# Patient Record
Sex: Male | Born: 1950 | Race: White | Hispanic: No | Marital: Married | Smoking: Never smoker
Health system: Southern US, Community
[De-identification: ages and names within clinical notes are randomized; demographics above are authoritative.]

## PROBLEM LIST (undated history)

## (undated) DIAGNOSIS — I10 Essential (primary) hypertension: Secondary | ICD-10-CM

---

## 2011-10-16 ENCOUNTER — Emergency Department
Admission: EM | Admit: 2011-10-16 | Discharge: 2011-10-16 | Disposition: A | Discharge status: Home / Self Care | Payer: 59 | Source: Home / Self Care | Attending: Emergency Medicine | Admitting: Emergency Medicine

## 2011-10-16 DIAGNOSIS — J069 Acute upper respiratory infection, unspecified: Secondary | ICD-10-CM

## 2011-10-16 HISTORY — DX: Essential (primary) hypertension: I10

## 2011-10-16 MED ORDER — PREDNISONE (PAK) 10 MG PO TABS: 10.0000 mg | ORAL_TABLET | Freq: Every day | ORAL | Status: AC

## 2011-10-16 MED ORDER — METHYLPREDNISOLONE SODIUM SUCC 125 MG IJ SOLR
125.0000 mg | Freq: Once | INTRAMUSCULAR | Status: AC
  Administered 2011-10-16: 125 mg via INTRAVENOUS

## 2011-10-16 NOTE — ED Notes (Signed)
Son ABT for sinus infection w/out improvement. Increase facial pain

## 2011-10-16 NOTE — ED Provider Notes (Signed)
History     CSN: [id number]  Arrival date & time 10/16/11  1644   First MD Initiated Contact with Patient 10/16/11 1655      Chief Complaint  Patient presents with  . Sinusitis    (Consider location/radiation/quality/duration/timing/severity/associated sxs/prior treatment) HPI Carlos Little is a 61 y.o. male who complains of onset of cold symptoms for 7 days.  He is already on Augmentin x4 days which isn't helping much.  He was around burning brush/trees before his symptoms started.  Also with multiple people at work who are sick and also sick people at home. No sore throat + cough No pleuritic pain No wheezing + nasal congestion + post-nasal drainage + sinus pain/pressure / teeth pain No chest congestion No itchy/red eyes + earache No hemoptysis No SOB No chills/sweats + fever No nausea No vomiting No abdominal pain No diarrhea No skin rashes No fatigue No myalgias + headache    Past Medical History  Diagnosis Date  . Hypertension     No past surgical history on file.  No family history on file.  History  Substance Use Topics  . Smoking status: Not on file  . Smokeless tobacco: Not on file  . Alcohol Use: No      Review of Systems  All other systems reviewed and are negative.    Allergies  Review of patient's allergies indicates no known allergies.  Home Medications   Current Outpatient Rx  Name Route Sig Dispense Refill  . PREDNISONE (PAK) 10 MG PO TABS Oral Take 1 tablet (10 mg total) by mouth daily. 6 day pack, use as directed, Disp 1 pack 1 tablet 0    There were no vitals taken for this visit.  Physical Exam  Nursing note and vitals reviewed. Constitutional: He is oriented to person, place, and time. He appears well-developed and well-nourished.  HENT:  Head: Normocephalic and atraumatic.  Right Ear: Tympanic membrane, external ear and ear canal normal.  Left Ear: Tympanic membrane, external ear and ear canal normal.  Nose: Mucosal  edema and rhinorrhea present.  Mouth/Throat: Posterior oropharyngeal erythema present. No oropharyngeal exudate or posterior oropharyngeal edema.  Eyes: No scleral icterus.  Neck: Neck supple.  Cardiovascular: Regular rhythm and normal heart sounds.   Pulmonary/Chest: Effort normal and breath sounds normal. No respiratory distress.  Neurological: He is alert and oriented to person, place, and time.  Skin: Skin is warm and dry.  Psychiatric: He has a normal mood and affect. His speech is normal.    ED Course  Procedures (including critical care time)  Labs Reviewed - No data to display No results found.   1. Acute upper respiratory infections of unspecified site       MDM  1)  Continue your antibiotic as instructed.  I think this is most likely viral, however I think that was treated with prednisone may help decrease some inflammation, swelling, and irritation in the sinuses. We'll also get a shot of Solu-Medrol here in clinic. If worsening, he should followup with PCP which time perhaps change antibiotic to something like Levaquin may be helpful. 2)  Use nasal saline solution (over the counter) at least 3 times a day. 3)  Use over the counter decongestants like Zyrtec-D every 12 hours as needed to help with congestion.  If you have hypertension, do not take medicines with sudafed.  4)  Can take tylenol every 6 hours or motrin every 8 hours for pain or fever. 5)  Follow up with your primary  doctor if no improvement in 5-7 days, sooner if increasing pain, fever, or new symptoms.          Marlaine Hind, MD 10/16/11 1734

## 2011-10-26 ENCOUNTER — Emergency Department
Admission: EM | Admit: 2011-10-26 | Discharge: 2011-10-26 | Disposition: A | Discharge status: Home / Self Care | Payer: 59 | Source: Home / Self Care | Attending: Emergency Medicine | Admitting: Emergency Medicine

## 2011-10-26 DIAGNOSIS — J069 Acute upper respiratory infection, unspecified: Secondary | ICD-10-CM

## 2011-10-26 DIAGNOSIS — J329 Chronic sinusitis, unspecified: Secondary | ICD-10-CM

## 2011-10-26 DIAGNOSIS — R5381 Other malaise: Secondary | ICD-10-CM

## 2011-10-26 DIAGNOSIS — R5383 Other fatigue: Secondary | ICD-10-CM

## 2011-10-26 LAB — POCT MONO SCREEN (KUC): Mono, POC: NEGATIVE

## 2011-10-26 MED ORDER — PREDNISONE 10 MG PO TABS: ORAL_TABLET | ORAL | Status: DC

## 2011-10-26 MED ORDER — LEVOFLOXACIN 500 MG PO TABS: 500.0000 mg | ORAL_TABLET | Freq: Every day | ORAL | Status: AC

## 2011-10-26 NOTE — ED Provider Notes (Signed)
History     CSN: [id number]  Arrival date & time 10/26/11  1925   First MD Initiated Contact with Patient 10/26/11 1926      Chief Complaint  Patient presents with  . Cough    (Consider location/radiation/quality/duration/timing/severity/associated sxs/prior treatment) HPI Carlos Little is a 61 y.o. male who complains of onset of cold symptoms for a few days. He was sick recently and given Augmentin and Prednisone (and IM solumedrol).  With these, he felt a lot better, but still lingering symptoms that seem to be getting worse again.  +Sick contacts.   + sore throat + cough No pleuritic pain No wheezing + nasal congestion + post-nasal drainage + sinus pain/pressure No chest congestion No itchy/red eyes No earache No hemoptysis No SOB No chills/sweats No fever No nausea No vomiting No abdominal pain No diarrhea No skin rashes + fatigue No myalgias + headache    Past Medical History  Diagnosis Date  . Hypertension     History reviewed. No pertinent past surgical history.  Family History  Problem Relation Age of Onset  . Hypertension Father     History  Substance Use Topics  . Smoking status: Never Smoker   . Smokeless tobacco: Not on file  . Alcohol Use: No      Review of Systems  All other systems reviewed and are negative.    Allergies  Review of patient's allergies indicates no known allergies.  Home Medications   Current Outpatient Rx  Name Route Sig Dispense Refill  . ASPIRIN 81 MG PO TABS Oral Take 81 mg by mouth daily.    Marland Kitchen LISINOPRIL 10 MG PO TABS Oral Take 10 mg by mouth daily.    . AMOXICILLIN-POT CLAVULANATE 875-125 MG PO TABS Oral Take 1 tablet by mouth 2 (two) times daily.    Marland Kitchen LEVOFLOXACIN 500 MG PO TABS Oral Take 1 tablet (500 mg total) by mouth daily. 7 tablet 0  . PREDNISONE 10 MG PO TABS  20mg  bid for 3 days, then 10mg  bid for 3 days, then 10mg  daily for 3 days, then stop.  Disp QS 21 tablet 0  . PREDNISONE (PAK) 10 MG PO TABS  Oral Take 1 tablet (10 mg total) by mouth daily. 6 day pack, use as directed, Disp 1 pack 1 tablet 0    BP 122/87  Pulse 101  Temp(Src) 98.9 F (37.2 C) (Oral)  Resp 16  Ht 5\' 7"  (1.702 m)  Wt 178 lb (80.74 kg)  BMI 27.88 kg/m2  SpO2 97%  Physical Exam  Nursing note and vitals reviewed. Constitutional: He is oriented to person, place, and time. He appears well-developed and well-nourished.  HENT:  Head: Normocephalic and atraumatic.  Right Ear: Tympanic membrane, external ear and ear canal normal.  Left Ear: Tympanic membrane, external ear and ear canal normal.  Nose: Mucosal edema and rhinorrhea present.  Mouth/Throat: Posterior oropharyngeal erythema present. No oropharyngeal exudate or posterior oropharyngeal edema.  Eyes: No scleral icterus.  Neck: Neck supple.  Cardiovascular: Regular rhythm and normal heart sounds.   Pulmonary/Chest: Effort normal and breath sounds normal. No respiratory distress.  Neurological: He is alert and oriented to person, place, and time.  Skin: Skin is warm and dry.  Psychiatric: He has a normal mood and affect. His speech is normal.    ED Course  Procedures (including critical care time)   Labs Reviewed  POCT MONO SCREEN [facility])   No results found.   1. Fatigue   2. Sinusitis  3. Acute upper respiratory infections of unspecified site       MDM  1)  Take the prescribed antibiotic as instructed.  Also Rx given for prednisone taper.  If not better, I discussed with patient that ENT referral may be necessary.  Mono test is negative. 2)  Use nasal saline solution (over the counter) at least 3 times a day. 3)  Use over the counter decongestants like Zyrtec-D every 12 hours as needed to help with congestion.  If you have hypertension, do not take medicines with sudafed.  4)  Can take tylenol every 6 hours or motrin every 8 hours for pain or fever. 5)  Follow up with your primary doctor if no improvement in 5-7 days, sooner if increasing  pain, fever, or new symptoms.       Marlaine Hind, MD 10/26/11 2005

## 2011-10-26 NOTE — ED Notes (Signed)
Patient complains of cough and fatigue. He states he has been sick off and on for a few weeks. He states he has slept more than usual.

## 2013-08-18 ENCOUNTER — Emergency Department (INDEPENDENT_AMBULATORY_CARE_PROVIDER_SITE_OTHER)
Admission: EM | Admit: 2013-08-18 | Discharge: 2013-08-18 | Disposition: A | Discharge status: Home / Self Care | Payer: BC Managed Care – PPO | Source: Home / Self Care | Attending: Emergency Medicine | Admitting: Emergency Medicine

## 2013-08-18 ENCOUNTER — Encounter: Payer: Self-pay | Admitting: Emergency Medicine

## 2013-08-18 DIAGNOSIS — J101 Influenza due to other identified influenza virus with other respiratory manifestations: Secondary | ICD-10-CM

## 2013-08-18 DIAGNOSIS — J111 Influenza due to unidentified influenza virus with other respiratory manifestations: Secondary | ICD-10-CM

## 2013-08-18 LAB — POCT INFLUENZA A/B
INFLUENZA A, POC: NEGATIVE
INFLUENZA B, POC: POSITIVE

## 2013-08-18 MED ORDER — OSELTAMIVIR PHOSPHATE 75 MG PO CAPS: ORAL_CAPSULE | ORAL | Status: DC

## 2013-08-18 NOTE — ED Notes (Signed)
Awoke this morning with congestion, cough, fever, body aches and nausea. No flu vaccination this season.

## 2013-08-19 NOTE — ED Provider Notes (Signed)
CSN: [id number]     Arrival date & time 08/18/13  1605 History   First MD Initiated Contact with Patient 08/18/13 1614     Chief Complaint  Patient presents with  . Fever  . Cough  . Nasal Congestion  . Nausea  . Generalized Body Aches   (Consider location/radiation/quality/duration/timing/severity/associated sxs/prior Treatment) HPI FLU  HPI : Flu symptoms for about 1 day. Fever to 102 with chills, sweats, myalgias, fatigue, headache. Symptoms are progressively worsening, despite trying OTC fever reducing medicine and rest and fluids. Has decreased appetite, but tolerating some liquids by mouth. No history of recent tick bite.  Review of Systems: Positive for fatigue, mild nasal congestion, mild sore throat, mild swollen anterior neck glands, mild cough. Negative for acute vision changes, stiff neck, focal weakness, syncope, seizures, respiratory distress, vomiting, diarrhea, GU symptoms, new Rash.  Past Medical History  Diagnosis Date  . Hypertension    History reviewed. No pertinent past surgical history. Family History  Problem Relation Age of Onset  . Hypertension Father    History  Substance Use Topics  . Smoking status: Never Smoker   . Smokeless tobacco: Not on file  . Alcohol Use: No    Review of Systems  All other systems reviewed and are negative.    Allergies  Review of patient's allergies indicates no known allergies.  Home Medications   Current Outpatient Rx  Name  Route  Sig  Dispense  Refill  . amoxicillin-clavulanate (AUGMENTIN) 875-125 MG per tablet   Oral   Take 1 tablet by mouth 2 (two) times daily.         Marland Kitchen aspirin 81 MG tablet   Oral   Take 81 mg by mouth daily.         Marland Kitchen lisinopril (PRINIVIL,ZESTRIL) 10 MG tablet   Oral   Take 10 mg by mouth daily.         Marland Kitchen oseltamivir (TAMIFLU) 75 MG capsule      Starting today, take 1 capsule by mouth twice a day for 5 days.   10 capsule   0   . predniSONE (DELTASONE) 10 MG tablet       20mg  bid for 3 days, then 10mg  bid for 3 days, then 10mg  daily for 3 days, then stop.  Disp QS   21 tablet   0    BP 113/72  Pulse 13  Temp(Src) 101.9 F (38.8 C) (Oral)  Resp 18  Ht 5\' 7"  (1.702 m)  Wt 175 lb (79.379 kg)  BMI 27.40 kg/m2  SpO2 95% Physical Exam  Nursing note and vitals reviewed. Constitutional: He appears well-developed and well-nourished.  Non-toxic appearance. He appears ill (very fatigued, but no cardiorespiratory distress). No distress.  HENT:  Head: Normocephalic and atraumatic.  Right Ear: Tympanic membrane and external ear normal.  Left Ear: Tympanic membrane and external ear normal.  Nose: Rhinorrhea present.  Mouth/Throat: Mucous membranes are normal. Posterior oropharyngeal erythema (mild redness ) present. No oropharyngeal exudate.  Eyes: Conjunctivae are normal. Right eye exhibits no discharge. Left eye exhibits no discharge. No scleral icterus.  Neck: Neck supple.  Cardiovascular: Normal rate, regular rhythm and normal heart sounds.   Pulmonary/Chest: Breath sounds normal. No stridor. No respiratory distress. He has no wheezes. He has no rales.  Abdominal: Soft. There is no tenderness.  Musculoskeletal: He exhibits no edema.  Lymphadenopathy:    He has cervical adenopathy (mild shoddy anterior cervical nodes).  Neurological: He is alert.  Skin: Skin is  warm and intact. No rash noted. He is diaphoretic.  Psychiatric: He has a normal mood and affect.    ED Course  Procedures (including critical care time) Labs Review Labs Reviewed  POCT INFLUENZA A/B   Imaging Review No results found.  EKG Interpretation    Date/Time:    Ventricular Rate:    PR Interval:    QRS Duration:   QT Interval:    QTC Calculation:   R Axis:     Text Interpretation:              MDM   1. Influenza B    Rapid flu test positive for influenza B.--Negative for influenza A. Treatment options discussed, as well as risks, benefits,  alternatives. Patient voiced understanding and agreement with the following plans: Tamiflu 75 twice a day x5 days I suggested preventive Tamiflu for family members and household 75 mg daily x10 days Other symptomatic care regarding influenza B. Contagiousness discussed. Written and verbal instructions. Precautions discussed. Red flags discussed. Questions invited and answered. Patient and wife voiced understanding and agreement.     Jacqulyn Cane, MD 08/19/13 1044

## 2013-11-19 ENCOUNTER — Telehealth: Payer: Self-pay | Admitting: Hematology & Oncology

## 2013-11-19 NOTE — Telephone Encounter (Signed)
I tried calling PCP office not able to reach anyone, I called and they put me in voice mail, when I called back they had forward their phones. I received a fax but I'm not sure it's a referral. I faxed chart and cover sheet to PCP and asked them to call me and if this is a referral.

## 2013-11-19 NOTE — Telephone Encounter (Signed)
Pt called wants to make appointment. He is aware his PCP needs to fax [place] referral.

## 2014-01-11 ENCOUNTER — Telehealth: Payer: Self-pay | Admitting: Hematology & Oncology

## 2014-01-11 NOTE — Telephone Encounter (Signed)
Left vm w NEW PATIENT today to remind them of their appointment with Dr. Ennever. Also, advised them to bring all medication bottles and insurance card information. ° °

## 2014-01-14 ENCOUNTER — Encounter: Payer: Self-pay | Admitting: Lab

## 2014-01-14 ENCOUNTER — Ambulatory Visit (HOSPITAL_BASED_OUTPATIENT_CLINIC_OR_DEPARTMENT_OTHER): Payer: BC Managed Care – PPO | Admitting: Lab

## 2014-01-14 ENCOUNTER — Ambulatory Visit: Payer: BC Managed Care – PPO

## 2014-01-14 ENCOUNTER — Ambulatory Visit (HOSPITAL_BASED_OUTPATIENT_CLINIC_OR_DEPARTMENT_OTHER): Payer: BC Managed Care – PPO | Admitting: Hematology & Oncology

## 2014-01-14 DIAGNOSIS — R760 Raised antibody titer: Secondary | ICD-10-CM

## 2014-01-14 DIAGNOSIS — R894 Abnormal immunological findings in specimens from other organs, systems and tissues: Secondary | ICD-10-CM

## 2014-01-14 LAB — CBC WITH DIFFERENTIAL (CANCER CENTER ONLY)
BASO#: 0 10*3/uL (ref 0.0–0.2)
BASO%: 0.4 % (ref 0.0–2.0)
EOS ABS: 0.2 10*3/uL (ref 0.0–0.5)
EOS%: 2.6 % (ref 0.0–7.0)
HCT: 44.1 % (ref 38.7–49.9)
HGB: 15.4 g/dL (ref 13.0–17.1)
LYMPH#: 2.3 10*3/uL (ref 0.9–3.3)
LYMPH%: 27.9 % (ref 14.0–48.0)
MCH: 32.2 pg (ref 28.0–33.4)
MCHC: 34.9 g/dL (ref 32.0–35.9)
MCV: 92 fL (ref 82–98)
MONO#: 0.7 10*3/uL (ref 0.1–0.9)
MONO%: 8.7 % (ref 0.0–13.0)
NEUT%: 60.4 % (ref 40.0–80.0)
NEUTROS ABS: 5.1 10*3/uL (ref 1.5–6.5)
PLATELETS: 299 10*3/uL (ref 145–400)
RBC: 4.79 10*6/uL (ref 4.20–5.70)
RDW: 11.7 % (ref 11.1–15.7)
WBC: 8.4 10*3/uL (ref 4.0–10.0)

## 2014-01-14 MED ORDER — FOLIC ACID 1 MG PO TABS: 1.0000 mg | ORAL_TABLET | Freq: Every day | ORAL | Status: DC

## 2014-01-14 MED ORDER — ASPIRIN 325 MG PO TABS: 325.0000 mg | ORAL_TABLET | Freq: Every day | ORAL | Status: DC

## 2014-01-14 MED ORDER — RIVAROXABAN 10 MG PO TABS: ORAL_TABLET | ORAL | Status: DC

## 2014-01-15 NOTE — Progress Notes (Signed)
Referral MD  Reason for Referral: IgM anti-Cardiolipin antibody (+)                                   (+) Lupus Anticoagulant  No chief complaint on file. : " Is there any blood clotting problems?"  HPI: [name] is a very nice 63 year old gentleman. He has been pretty healthy. He has had some vague neurological issues. He has had a studies for this. He's had scans, Dopplers, x-rays. Everything has come back unremarkable. His doctor decided to do a hypercoagulable panel on him. This showed that he had a elevated IgM anticardiolipin antibody titer of 64. He also was found to have a positive lupus anticoagulant. No other studies were performed.  He has been on aspirin. At that he takes 2 baby aspirin a day.  He is worried that he has upcoming right shoulder surgery. This will be an arthroscopic. He needs to be off aspirin. He's worried about this.  His family Dr. Mitchell Heir refered him to [place] to see if we cannot help him out.  [name] has had no obvious stroke or TIA. He's had no obvious thromboembolic disease.  I think there is a history of stroke in the family.  He's had no rashes. He's had no cough. No chest pain. He said the shortness of breath. He's had no nausea vomiting. There's been no change in bowel or bladder habits. He is on testosterone shots. He gets 2 injections a week.  He has had a good appetite. He has tried to exercise. He has had no type foreign travel or lengthy travel.     Past Medical History  Diagnosis Date  . Hypertension   :  No past surgical history on file.:  Current outpatient prescriptions:amoxicillin-clavulanate (AUGMENTIN) 875-125 MG per tablet, Take 1 tablet by mouth 2 (two) times daily., Disp: , Rfl: ;  aspirin (BAYER ASPIRIN) 325 MG tablet, Take 1 tablet (325 mg total) by mouth daily., Disp: , Rfl: ;  folic acid (FOLVITE) 1 MG tablet, Take 1 tablet (1 mg total) by mouth daily., Disp: 30 tablet, Rfl: 12;  lisinopril (PRINIVIL,ZESTRIL) 10 MG tablet,  Take 10 mg by mouth daily., Disp: , Rfl:  oseltamivir (TAMIFLU) 75 MG capsule, Starting today, take 1 capsule by mouth twice a day for 5 days., Disp: 10 capsule, Rfl: 0;  predniSONE (DELTASONE) 10 MG tablet, 20mg  bid for 3 days, then 10mg  bid for 3 days, then 10mg  daily for 3 days, then stop.  Disp QS, Disp: 21 tablet, Rfl: 0;  rivaroxaban (XARELTO) 10 MG TABS tablet, Take 1 a pill a day for 5 days then stop 2 days before surgery, Disp: 5 tablet, Rfl: 0:  :  No Known Allergies:  Family History  Problem Relation Age of Onset  . Hypertension Father   :  History   Social History  . Marital Status: Married    Spouse Name: N/A    Number of Children: N/A  . Years of Education: N/A   Occupational History  . Not on file.   Social History Main Topics  . Smoking status: Never Smoker   . Smokeless tobacco: Not on file  . Alcohol Use: No  . Drug Use: No  . Sexual Activity:    Other Topics Concern  . Not on file   Social History Narrative  . No narrative on file  :  Pertinent items are noted in  HPI.  Exam: @IPVITALS @ well-developed and well-nourished white gentleman in no obvious distress. Vital signs show a temperature of 98. Pulse 82. Blood pressure 138/81. Weight is 176 pounds. Head and neck exam shows no ocular or oral lesions. There are no palpable cervical or supraclavicular lymph nodes. Lungs are clear. Cardiac exam irregular rate and rhythm with no murmurs rubs or bruits. Abdomen is soft. Has good bowel sounds. There is no fluid wave. There is no palpable liver or spleen tip. Back exam no tenderness over the spine ribs or hips. Extremities shows no clubbing cyanosis or edema. Has good range of motion of his joints. Skin exam shows no rashes. Neurological exam shows no focal neurological deficits.    Recent Labs  01/14/14 1324  WBC 8.4  HGB 15.4  HCT 44.1  PLT 299   No results found for this basename: NA, K, CL, CO2, GLUCOSE, BUN, CREATININE, CALCIUM,  in the last 72  hours  Blood smear review:Not relevant  Pathology:No data     Assessment and Plan: Carlos Little is a 63 year old gentleman. He has not had a documented thromboembolic event. This includes both arterial or venous events. 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 He also has a positive lupus anticoagulant. I am repeating his studies. I think these were done back in January. We will see what they look like now.  I would think that he probably needs to be on a full dose aspirin. If he has a cerebrovascular event or cardiovascular event on a full dose aspirin, then Plavix probably will be needed.  He also has a mutated MTHFR chain. I am really not sure what this really means but I told him to take 1 mg of folic acid a day.  Been on testosterone replacement therapy, in my mind, does increase the risk of thrombotic events. However, this is something that makes him feel better and more productive. It would be hard to stop this as he would likely have negative effects from this.  Am not sure he really needs to have it 2 injections a week. I think that he does stay on testosterone supplementation, I think that we need to  keep his testosterone level just above the normal range of therapeutic.  We will see what his hypercoagulable panel shows.  His wife is a Marine scientist. She currently has a good understanding of the issues. He also has a very good understanding of what is going on.  I told them that our goal was to minimize his risk of actual thromboembolic events.  As far as his shoulder surgery goes, I think that getting him transitioned over to some of Xarelto before surgery would make sense. I think this would be reasonable. It would give him a piece of mind that he is on some blood thinner so that he would not have a stroke.  I think Xarelto at 10 mg a day for 5 days and then stop for 2 days prior to surgery would be reasonable. After surgery, are just plan to get him back on  aspirin.  I had a long talk with Mr. Minney and his wife. I spent over an hour with him. I went over the lab work that had been done before.  Hopefully, I don't think that we have to see him back in the clinic. I not sure, if we really be helping. If she does have a thromboembolic event, he will definitely need to be seen.

## 2014-01-17 LAB — HYPERCOAGULABLE PANEL, COMPREHENSIVE
ANTICARDIOLIPIN IGG: 11 GPL U/mL (ref <23)
ANTICARDIOLIPIN IGM: 29 [MPL'U]/mL — AB (ref <11)
AntiThromb III Func: 105 % (ref 76–126)
Anticardiolipin IgA: 4 APL U/mL (ref <22)
BETA 2 GLYCO I IGG: 13 G Units (ref <20)
Beta-2-Glycoprotein I IgA: 26 A Units — ABNORMAL HIGH (ref <20)
Beta-2-Glycoprotein I IgM: 60 M Units — ABNORMAL HIGH (ref <20)
DRVVT 1 1 MIX: 44.3 s — AB (ref <42.9)
DRVVT: 91.5 s — AB (ref <42.9)
Drvvt confirmation: 2.29 Ratio — ABNORMAL HIGH (ref <1.11)
LUPUS ANTICOAGULANT: DETECTED — AB
PROTEIN C ACTIVITY: 161 % — AB (ref 75–133)
PROTEIN S TOTAL: 87 % (ref 60–150)
PTT Lupus Anticoagulant: 47.2 secs — ABNORMAL HIGH (ref 28.0–43.0)
PTTLA 4:1 Mix: 46.3 secs — ABNORMAL HIGH (ref 28.0–43.0)
PTTLA CONFIRMATION: 17.6 s — AB (ref <8.0)
Protein C, Total: 90 % (ref 72–160)
Protein S Activity: 103 % (ref 69–129)

## 2014-01-17 LAB — APTT: aPTT: 40 seconds — ABNORMAL HIGH (ref 24–37)

## 2014-01-17 LAB — PROTHROMBIN TIME
INR: 0.98 (ref <1.50)
PROTHROMBIN TIME: 12.9 s (ref 11.6–15.2)

## 2014-01-23 LAB — HEXAGONAL PHOSPHOLIPID NEUTRALIZATION: Hex Phosph Neut Test: POSITIVE — AB

## 2015-01-22 ENCOUNTER — Other Ambulatory Visit: Payer: Self-pay | Admitting: *Deleted

## 2015-01-23 ENCOUNTER — Other Ambulatory Visit: Payer: Self-pay

## 2015-01-23 ENCOUNTER — Ambulatory Visit (HOSPITAL_BASED_OUTPATIENT_CLINIC_OR_DEPARTMENT_OTHER): Payer: BLUE CROSS/BLUE SHIELD | Admitting: Hematology & Oncology

## 2015-01-23 ENCOUNTER — Encounter: Payer: Self-pay | Admitting: Hematology & Oncology

## 2015-01-23 DIAGNOSIS — R768 Other specified abnormal immunological findings in serum: Secondary | ICD-10-CM | POA: Diagnosis not present

## 2015-01-23 DIAGNOSIS — Z7901 Long term (current) use of anticoagulants: Secondary | ICD-10-CM

## 2015-01-23 DIAGNOSIS — D6862 Lupus anticoagulant syndrome: Secondary | ICD-10-CM

## 2015-01-23 DIAGNOSIS — R760 Raised antibody titer: Secondary | ICD-10-CM

## 2015-01-23 MED ORDER — FOLIC ACID 1 MG PO TABS: 2.0000 mg | ORAL_TABLET | Freq: Every day | ORAL | Status: DC

## 2015-01-23 MED ORDER — RIVAROXABAN 10 MG PO TABS: ORAL_TABLET | ORAL | Status: DC

## 2015-01-23 NOTE — Progress Notes (Signed)
Hematology and Oncology Follow Up Visit  Carlos Little [id number] [birth year] 64 y.o. 01/23/2015   Principle Diagnosis:    (+)Lupus anticoagulant and IgM anti-cardiolipin antibody    no history of thromboembolic disease  Current Therapy:     Xarelto 5 mg by mouth daily    folic acid 2 mg by mouth daily     Interim History:  Carlos Little is back to see [place] for a visit. We first saw him back in June 2015. At that point time, his  Hypercoagulable studies showed a positive lupus anticoagulant that was confirmed positive via the Hexose phosphate neutralization test. He had a positive IgM anticardiolipin antibody at a titer of 29. His IgM beta-2 glycoprotein level was high at 60.   At that point time, I just put him on full dose aspirin. He had never had a thromboembolic event.  The problem that he has now is at he has rotator cuff issues with his  Right shoulder. He has seen a specialist who wants to do plasma injections and not surgery. To do this, he needs to be off aspirin for 4 months. Afterwards, his right shoulder needs to be immobilized for a month and he still cannot be on any aspirin for 2 months.   This is incredibly concerning for Carlos Little with respect to having blood clots. Again, he is never had a blood clot despite the positive blood tests.   I worry that with the immobilization that will happen with his right arm, that he will be at significant risk for thromboembolic disease.   he is feeling well otherwise. He really cannot work out the way he would like because of the pain in the right shoulder secondary to the rotator cuff tear.    his appetite is good. He's had no problems with nausea or vomiting. Medications:  Current outpatient prescriptions:  .  aspirin (BAYER ASPIRIN) 325 MG tablet, Take 1 tablet (325 mg total) by mouth daily., Disp: , Rfl:  .  folic acid (FOLVITE) 1 MG tablet, Take 1 tablet (1 mg total) by mouth daily., Disp: 30 tablet, Rfl: 12 .  lisinopril  (PRINIVIL,ZESTRIL) 10 MG tablet, Take 10 mg by mouth daily., Disp: , Rfl:  .  rivaroxaban (XARELTO) 10 MG TABS tablet, Please take one half tablet a day. Stop 2 days before any invasive procedure., Disp: 30 tablet, Rfl: 6 .  folic acid (FOLVITE) 1 MG tablet, Take 2 tablets (2 mg total) by mouth daily., Disp: 60 tablet, Rfl: 8 .  testosterone cypionate (DEPOTESTOSTERONE CYPIONATE) 200 MG/ML injection, , Disp: , Rfl: 0  Allergies: No Known Allergies  Past Medical History, Surgical history, Social history, and Family History were reviewed and updated.  Review of Systems:  as above  Physical Exam:  vitals were not taken for this visit.  Wt Readings from Last 3 Encounters:  01/14/14 176 lb (79.833 kg)  08/18/13 175 lb (79.379 kg)  10/26/11 178 lb (80.74 kg)      well-developed and well-nourished white gentleman. Head and neck exam shows no ocular or oral lesions. He has no palpable cervical or supraclavicular lymph nodes. Lungs are clear. Cardiac exam regular rate and rhythm with no murmurs, rubs or bruits. Abdomen is soft. He has good bowel sounds. There is no fluid wave. There is no palpable liver or spleen tip. Back exam shows no tenderness over the spine, ribs or hips. Extremities show decreased range of motion of the right arm. Left arm is unremarkable. 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 He has good range of motion of his legs. Neurological exam is nonfocal.  Lab Results  Component Value Date   WBC 8.4 01/14/2014   HGB 15.4 01/14/2014   HCT 44.1 01/14/2014   MCV 92 01/14/2014   PLT 299 01/14/2014     Chemistry   No results found for: NA, K, CL, CO2, BUN, CREATININE, GLU No results found for: CALCIUM, ALKPHOS, AST, ALT, BILITOT       Impression and Plan: Carlos Little is  64 year old gentleman with a positive lupus anticoagulant and a elevated IgM anti-cardiolipin antibody. He has a rotator cuff issues.    I told him that I saw no problems with him having this plasma  injection. I told him that we can certainly adjust his "anticoagulant " therapy.  I think what would be reasonable would be to have  hm on 5 mg of Xarelto.  I also would put him on folic acid at 2 mg a day. I think this should be very reasonable for anticoagulant protection.   I told him that I would stop the Xarelto 2 days before any procedure that is done for his right shoulder. Afterwards, since his right arm will be immobilized for a month, I would then put him on 10 mg a day of Xarelto.   He is not sure when all this will be done. He will speak to the specialist about all of this. I told him to have the specialist call me if there are any issues.   I told Carlos Little to call me to let me know when the procedure will be done so that we can see him back and make sure that he is on the right therapy after the procedure.   I spent about 35 minutes with he and his wife today.   Volanda Napoleon, MD 6/16/20162:09 PM

## 2015-01-28 ENCOUNTER — Ambulatory Visit: Payer: Self-pay | Admitting: Hematology & Oncology

## 2015-01-28 ENCOUNTER — Other Ambulatory Visit: Payer: Self-pay

## 2015-06-20 ENCOUNTER — Other Ambulatory Visit: Payer: Self-pay | Admitting: *Deleted

## 2015-06-20 DIAGNOSIS — D472 Monoclonal gammopathy: Secondary | ICD-10-CM

## 2015-06-23 ENCOUNTER — Encounter: Payer: Self-pay | Admitting: Hematology & Oncology

## 2015-06-23 ENCOUNTER — Other Ambulatory Visit (HOSPITAL_BASED_OUTPATIENT_CLINIC_OR_DEPARTMENT_OTHER): Payer: BLUE CROSS/BLUE SHIELD

## 2015-06-23 ENCOUNTER — Ambulatory Visit (HOSPITAL_BASED_OUTPATIENT_CLINIC_OR_DEPARTMENT_OTHER): Payer: BLUE CROSS/BLUE SHIELD | Admitting: Hematology & Oncology

## 2015-06-23 VITALS — BP 139/79 | HR 88 | Temp 98.0°F | Resp 18 | Ht 66.0 in | Wt 173.0 lb

## 2015-06-23 DIAGNOSIS — R760 Raised antibody titer: Secondary | ICD-10-CM

## 2015-06-23 DIAGNOSIS — D472 Monoclonal gammopathy: Secondary | ICD-10-CM

## 2015-06-23 DIAGNOSIS — D6862 Lupus anticoagulant syndrome: Secondary | ICD-10-CM | POA: Diagnosis not present

## 2015-06-23 LAB — PROTIME-INR (CHCC SATELLITE)
INR: 1 — AB (ref 2.0–3.5)
PROTIME: 12 s (ref 10.6–13.4)

## 2015-06-23 NOTE — Progress Notes (Signed)
Hematology and Oncology Follow Up Visit  Carlos Little [id number] [birth year] 64 y.o. 06/23/2015   Principle Diagnosis:    (+)Lupus anticoagulant and IgM anti-cardiolipin antibody    No history of thromboembolic disease  Current Therapy:     Xarelto 5 mg by mouth daily    folic acid 2 mg by mouth daily     Interim History:  Carlos Little is back to see [place] for a visit. We first saw him back in June 2015. At that point time, his  Hypercoagulable studies showed a positive lupus anticoagulant that was confirmed positive via the Hexose phosphate neutralization test. He had a positive IgM anticardiolipin antibody at a titer of 29. His IgM beta-2 glycoprotein level was high at 60.   We saw him back in June of this year, he still was positive for the lupus anticoagulant his IgM anticardiolipin antibody was 29. He had an elevated beta-2 glycoprotein of 60.  He probably will be having the plasma injection into his right shoulder in December. I think he will be on December 12.  He says that with the Xarelto, he has occasional dizziness. He had one episode of confusion. I told him I just do not think that this was from the [place].  He's had no obvious bleeding.  He's not been able to work out that much because of the shoulder.  .Medications:  Current outpatient prescriptions:  .  anastrozole (ARIMIDEX) 1 MG tablet, TAKE 1/4 TABLET TWICE WEEKLY, Disp: , Rfl: 5 .  ARMOUR THYROID 90 MG tablet, Take 90 mg by mouth daily., Disp: , Rfl: 2 .  folic acid (FOLVITE) 1 MG tablet, Take 1 tablet (1 mg total) by mouth daily., Disp: 30 tablet, Rfl: 12 .  folic acid (FOLVITE) 1 MG tablet, Take 2 tablets (2 mg total) by mouth daily., Disp: 60 tablet, Rfl: 8 .  lisinopril (PRINIVIL,ZESTRIL) 10 MG tablet, Take 5 mg by mouth daily. , Disp: , Rfl:  .  rivaroxaban (XARELTO) 10 MG TABS tablet, Please take one half tablet a day. Stop 2 days before any invasive procedure., Disp: 30 tablet, Rfl: 6 .  testosterone  cypionate (DEPOTESTOSTERONE CYPIONATE) 200 MG/ML injection, , Disp: , Rfl: 0 .  VIAGRA 100 MG tablet, TAKE 1 TABLET BY MOUTH 1 HOUR PRIOR TO INTERCOURSE, Disp: , Rfl: 5  Allergies: No Known Allergies  Past Medical History, Surgical history, Social history, and Family History were reviewed and updated.  Review of Systems:  as above  Physical Exam:  height is 5\' 6"  (1.676 m) and weight is 173 lb (78.472 kg). His oral temperature is 98 F (36.7 C). His blood pressure is 139/79 and his pulse is 88. His respiration is 18.   Wt Readings from Last 3 Encounters:  06/23/15 173 lb (78.472 kg)  01/14/14 176 lb (79.833 kg)  08/18/13 175 lb (79.379 kg)      well-developed and well-nourished white gentleman. Head and neck exam shows no ocular or oral lesions. He has no palpable cervical or supraclavicular lymph nodes. Lungs are clear. Cardiac exam regular rate and rhythm with no murmurs, rubs or bruits. Abdomen is soft. He has good bowel sounds. There is no fluid wave. There is no palpable liver or spleen tip. Back exam shows no tenderness over the spine, ribs or hips. Extremities show decreased range of motion of the right arm. Left arm is unremarkable. 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 He has good range of motion of his legs. Neurological exam is nonfocal.  Lab Results  Component Value Date   WBC 8.4 01/14/2014   HGB 15.4 01/14/2014   HCT 44.1 01/14/2014   MCV 92 01/14/2014   PLT 299 01/14/2014     Chemistry   No results found for: NA, K, CL, CO2, BUN, CREATININE, GLU No results found for: CALCIUM, ALKPHOS, AST, ALT, BILITOT       Impression and Plan: Carlos Little is  64 year old gentleman with a positive lupus anticoagulant and a elevated IgM anti-cardiolipin antibody. He has a rotator cuff issues.    I told him that I saw no problems with him having this plasma injection. I told him that we can certainly adjust his "anticoagulant " therapy.  I think what would be reasonable would  be to have  hm on 5 mg of Xarelto.  I also would put him on folic acid at 2 mg a day. I think this should be very reasonable for anticoagulant protection.   I told him that I would stop the Xarelto 2 days before any procedure that is done for his right shoulder. Afterwards, I would restart his Xarelto at 5 mg a day. I'll start this the day after his procedure. Apparently, his shoulder will be immobilized for a month.  I also told him that it is important that he take the folic acid daily.  I will like to see him back probably 3 weeks or so after the procedure.  Again, since he does not have any issue or any past history of thromboembolic disease, we probably can get him off the Xarelto after his shoulder attain some mobility.  I spent about 35 minutes with he and his wife. They're always fun to talk to.   Volanda Napoleon, MD 11/14/20165:36 PM

## 2015-06-24 LAB — APTT: aPTT: 49 seconds — ABNORMAL HIGH (ref 24–37)

## 2015-08-18 ENCOUNTER — Other Ambulatory Visit (HOSPITAL_BASED_OUTPATIENT_CLINIC_OR_DEPARTMENT_OTHER): Payer: BLUE CROSS/BLUE SHIELD

## 2015-08-18 ENCOUNTER — Encounter: Payer: Self-pay | Admitting: Hematology & Oncology

## 2015-08-18 ENCOUNTER — Ambulatory Visit (HOSPITAL_BASED_OUTPATIENT_CLINIC_OR_DEPARTMENT_OTHER): Payer: BLUE CROSS/BLUE SHIELD | Admitting: Hematology & Oncology

## 2015-08-18 VITALS — BP 121/81 | HR 86 | Temp 98.6°F | Resp 16 | Ht 66.0 in | Wt 173.0 lb

## 2015-08-18 DIAGNOSIS — R790 Abnormal level of blood mineral: Secondary | ICD-10-CM | POA: Diagnosis not present

## 2015-08-18 DIAGNOSIS — R760 Raised antibody titer: Secondary | ICD-10-CM

## 2015-08-18 DIAGNOSIS — R894 Abnormal immunological findings in specimens from other organs, systems and tissues: Secondary | ICD-10-CM | POA: Diagnosis not present

## 2015-08-18 LAB — CBC WITH DIFFERENTIAL (CANCER CENTER ONLY)
BASO#: 0 10*3/uL (ref 0.0–0.2)
BASO%: 0.7 % (ref 0.0–2.0)
EOS ABS: 0.1 10*3/uL (ref 0.0–0.5)
EOS%: 1.8 % (ref 0.0–7.0)
HCT: 44.8 % (ref 38.7–49.9)
HGB: 15.4 g/dL (ref 13.0–17.1)
LYMPH#: 1.9 10*3/uL (ref 0.9–3.3)
LYMPH%: 34.5 % (ref 14.0–48.0)
MCH: 30.7 pg (ref 28.0–33.4)
MCHC: 34.4 g/dL (ref 32.0–35.9)
MCV: 89 fL (ref 82–98)
MONO#: 0.8 10*3/uL (ref 0.1–0.9)
MONO%: 14.1 % — AB (ref 0.0–13.0)
NEUT#: 2.8 10*3/uL (ref 1.5–6.5)
NEUT%: 48.9 % (ref 40.0–80.0)
PLATELETS: 289 10*3/uL (ref 145–400)
RBC: 5.01 10*6/uL (ref 4.20–5.70)
RDW: 11.7 % (ref 11.1–15.7)
WBC: 5.6 10*3/uL (ref 4.0–10.0)

## 2015-08-18 LAB — COMPREHENSIVE METABOLIC PANEL
ALT: 30 U/L (ref 0–55)
AST: 22 U/L (ref 5–34)
Albumin: 3.8 g/dL (ref 3.5–5.0)
Alkaline Phosphatase: 67 U/L (ref 40–150)
Anion Gap: 7 mEq/L (ref 3–11)
BUN: 12.7 mg/dL (ref 7.0–26.0)
CO2: 25 meq/L (ref 22–29)
Calcium: 9 mg/dL (ref 8.4–10.4)
Chloride: 105 mEq/L (ref 98–109)
Creatinine: 0.9 mg/dL (ref 0.7–1.3)
GLUCOSE: 94 mg/dL (ref 70–140)
POTASSIUM: 4.4 meq/L (ref 3.5–5.1)
SODIUM: 137 meq/L (ref 136–145)
Total Bilirubin: 0.78 mg/dL (ref 0.20–1.20)
Total Protein: 6.8 g/dL (ref 6.4–8.3)

## 2015-08-18 NOTE — Progress Notes (Signed)
Hematology and Oncology Follow Up Visit  Carlos Little [id number] 04/25/1951 65 y.o. 08/18/2015   Principle Diagnosis:    (+)Lupus anticoagulant and IgM anti-cardiolipin antibody    No history of thromboembolic disease  Current Therapy:     Xarelto 5 mg by mouth daily - to stop this and go onto aspirin when cleared by his rotator cuff specialist   folic acid 2 mg by mouth daily     Interim History:  Carlos Little is back to see [place] for a visit. He had the platelet injections into his shoulder back in December. He goes to see his doctor tomorrow. He is still not doing any physical therapy. Hopefully, this will start soon.  He has done well with the Xarelto. He is on 5 mg. He's had no issues with swelling or blood clots.  He's not been able to work out that much because of the shoulder.  It sounds like he may had to have actual rotator cuff surgery. When he goes back to see the specialist, a decision will be made.  Is not able to take any kind of aspirin or nonsteroidal anti-inflammatory drug right now. Hopefully, he will be able to get back on aspirin. If so, then he can stop the Xarelto. I talked him about this.  He's had no issues with bleeding. He's had no change in bowel or bladder habits. He's had no leg swelling or leg pain.  Overall, his performance status is ECOG 1.  .Medications:  Current outpatient prescriptions:  .  anastrozole (ARIMIDEX) 1 MG tablet, TAKE 1/4 TABLET TWICE WEEKLY, Disp: , Rfl: 5 .  ARMOUR THYROID 90 MG tablet, Take 90 mg by mouth daily., Disp: , Rfl: 2 .  folic acid (FOLVITE) 1 MG tablet, Take 1 tablet (1 mg total) by mouth daily., Disp: 30 tablet, Rfl: 12 .  folic acid (FOLVITE) 1 MG tablet, Take 2 tablets (2 mg total) by mouth daily., Disp: 60 tablet, Rfl: 8 .  lisinopril (PRINIVIL,ZESTRIL) 10 MG tablet, Take 5 mg by mouth daily. , Disp: , Rfl:  .  rivaroxaban (XARELTO) 10 MG TABS tablet, Please take one half tablet a day. Stop 2 days before any  invasive procedure., Disp: 30 tablet, Rfl: 6 .  testosterone cypionate (DEPOTESTOSTERONE CYPIONATE) 200 MG/ML injection, , Disp: , Rfl: 0 .  VIAGRA 100 MG tablet, TAKE 1 TABLET BY MOUTH 1 HOUR PRIOR TO INTERCOURSE, Disp: , Rfl: 5  Allergies: No Known Allergies  Past Medical History, Surgical history, Social history, and Family History were reviewed and updated.  Review of Systems:  as above  Physical Exam:  height is 5\' 6"  (1.676 m) and weight is 173 lb (78.472 kg). His oral temperature is 98.6 F (37 C). His blood pressure is 121/81 and his pulse is 86. His respiration is 16.   Wt Readings from Last 3 Encounters:  08/18/15 173 lb (78.472 kg)  06/23/15 173 lb (78.472 kg)  01/14/14 176 lb (79.833 kg)      well-developed and well-nourished white gentleman. Head and neck exam shows no ocular or oral lesions. He has no palpable cervical or supraclavicular lymph nodes. Lungs are clear. Cardiac exam regular rate and rhythm with no murmurs, rubs or bruits. Abdomen is soft. He has good bowel sounds. There is no fluid wave. There is no palpable liver or spleen tip. Back exam shows no tenderness over the spine, ribs or hips. Extremities show decreased range of motion of the right arm. Left arm is unremarkable. 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 He has good range of motion of his legs. Neurological exam is nonfocal.  Lab Results  Component Value Date   WBC 5.6 08/18/2015   HGB 15.4 08/18/2015   HCT 44.8 08/18/2015   MCV 89 08/18/2015   PLT 289 08/18/2015     Chemistry      Component Value Date/Time   NA 137 08/18/2015 1107   K 4.4 08/18/2015 1107   CO2 25 08/18/2015 1107   BUN 12.7 08/18/2015 1107   CREATININE 0.9 08/18/2015 1107      Component Value Date/Time   CALCIUM 9.0 08/18/2015 1107   ALKPHOS 67 08/18/2015 1107   AST 22 08/18/2015 1107   ALT 30 08/18/2015 1107   BILITOT 0.78 08/18/2015 1107         Impression and Plan: Carlos Little is  65 year old gentleman with a  positive lupus anticoagulant and an elevated IgM anti-cardiolipin antibody.  minutes with he and his wife. They're always fun to talk to.   For now, he will stay on the Xarelto. He is just on low-dose Xarelto. He's also on folic acid.  Whenever his rotator cuff specialist says he go on to aspirin, I told him that would be fine and that he get off the Xarelto. He needs to stay on the folic acid.  If, for some reason, he will need actual rotator cuff surgery, I told him to let us know so we can monitor his anticoagulation in the perioperative period.  I don't think we have to see him back unless he is going to have surgery in the future.   Volanda Napoleon, MD 1/9/20174:31 PM

## 2015-08-20 LAB — CARDIOLIPIN ANTIBODIES, IGG, IGM, IGA
Anticardiolipin Ab,IgA,Qn: <9 APL U/mL (ref 0–11)
Anticardiolipin Ab,IgM,Qn: 74 MPL U/mL — ABNORMAL HIGH (ref 0–12)

## 2015-08-21 ENCOUNTER — Telehealth: Payer: Self-pay | Admitting: *Deleted

## 2015-08-21 NOTE — Telephone Encounter (Signed)
-----   Message from Volanda Napoleon, MD sent at 08/21/2015  7:09 AM EST ----- Please fax this results to the MD that is helping his right shoulder!!!  Send a copy of this to the patient!!  Thanks!! pete

## 2015-08-22 LAB — LUPUS ANTICOAGULANT PANEL
DRVVT CONFIRM: 2.7 ratio — AB (ref 0.8–1.2)
DRVVT MIX: 62.2 s — AB (ref 0.0–44.0)
HEXAGONAL PHASE PHOSPHOLIPID: 50 s — AB (ref 0–11)
PTT-LA Mix: 60.1 s — ABNORMAL HIGH (ref 0.0–40.6)
PTT-LA: 64.3 s — ABNORMAL HIGH (ref 0.0–40.6)
dRVVT: 125.6 s — ABNORMAL HIGH (ref 0.0–44.0)

## 2015-09-03 ENCOUNTER — Encounter: Payer: Self-pay | Admitting: *Deleted

## 2015-09-03 ENCOUNTER — Emergency Department
Admission: EM | Admit: 2015-09-03 | Discharge: 2015-09-03 | Disposition: A | Discharge status: Home / Self Care | Payer: BLUE CROSS/BLUE SHIELD | Source: Home / Self Care | Attending: Family Medicine | Admitting: Family Medicine

## 2015-09-03 ENCOUNTER — Emergency Department (INDEPENDENT_AMBULATORY_CARE_PROVIDER_SITE_OTHER): Payer: BLUE CROSS/BLUE SHIELD

## 2015-09-03 DIAGNOSIS — R079 Chest pain, unspecified: Secondary | ICD-10-CM | POA: Diagnosis not present

## 2015-09-03 DIAGNOSIS — R350 Frequency of micturition: Secondary | ICD-10-CM

## 2015-09-03 DIAGNOSIS — R0789 Other chest pain: Secondary | ICD-10-CM

## 2015-09-03 LAB — POCT CBC W AUTO DIFF (K'VILLE URGENT CARE)

## 2015-09-03 LAB — POCT URINALYSIS DIP (MANUAL ENTRY)
BILIRUBIN UA: NEGATIVE
Bilirubin, UA: NEGATIVE
Glucose, UA: NEGATIVE
Nitrite, UA: NEGATIVE
PROTEIN UA: NEGATIVE
RBC UA: NEGATIVE
SPEC GRAV UA: 1.015 (ref 1.005–1.03)
UROBILINOGEN UA: 0.2 (ref 0–1)
pH, UA: 5.5 (ref 5–8)

## 2015-09-03 MED ORDER — SULFAMETHOXAZOLE-TRIMETHOPRIM 800-160 MG PO TABS: 1.0000 | ORAL_TABLET | Freq: Two times a day (BID) | ORAL | Status: DC

## 2015-09-03 NOTE — Discharge Instructions (Signed)
Increase fluid intake. °If symptoms become significantly worse during the night or over the weekend, proceed to the local emergency room.  °

## 2015-09-03 NOTE — ED Provider Notes (Signed)
CSN: [id number]     Arrival date & time 09/03/15  1312 History   First MD Initiated Contact with Patient 09/03/15 1327     Chief Complaint  Patient presents with  . Otalgia  . Dysuria  . Chest Pain      HPI Comments: Patient notes that he had a cold-like illness about a month ago which resolved.  During the past week he has had a vague intermittent mild pain under his left breast.  The pain does not occur with physical activity.  He visited his cardiologis yesterday where an EKG showed no changes.  Echocardiogram was negative, and troponin was negative.  During the past 6 days he has felt "loopey," without feeling dizzy.   About one week ago after intercourse he developed mild dysuria/frequency that has persisted.  He has had chills for the past 3 to 4 days, and mild lower back ache.  He also has had mild sore throat and sinus congestion for 3 to 4 days.  Last night he swallowed some lettuce followed by a coughing episode, and today his left chest discomfort is worse with increased cough.  He is concerned that he may have aspirated.  The history is provided by the patient and the spouse.    Past Medical History  Diagnosis Date  . Hypertension    History reviewed. No pertinent past surgical history. Family History  Problem Relation Age of Onset  . Hypertension Father    Social History  Substance Use Topics  . Smoking status: Never Smoker   . Smokeless tobacco: Never Used     Comment: NEVER USED TOBACCO  . Alcohol Use: No    Review of Systems + sore throat + cough ? pleuritic pain No wheezing ? nasal congestion No post-nasal drainage No sinus pain/pressure No itchy/red eyes No earache + feeling off balance No hemoptysis No SOB No fever, + chills No nausea No vomiting No abdominal pain No diarrhea + mild dysuria/frequency. + low back pain No skin rash + fatigue No myalgias No headache Used OTC meds without relief  Allergies  Review of patient's allergies  indicates no known allergies.  Home Medications   Prior to Admission medications   Medication Sig Start Date End Date Taking? Authorizing Provider  anastrozole (ARIMIDEX) 1 MG tablet TAKE 1/4 TABLET TWICE WEEKLY 05/01/15   Historical Provider, MD  ARMOUR THYROID 90 MG tablet Take 90 mg by mouth daily. 06/08/15   Historical Provider, MD  folic acid (FOLVITE) 1 MG tablet Take 1 tablet (1 mg total) by mouth daily. 01/14/14   Volanda Napoleon, MD  folic acid (FOLVITE) 1 MG tablet Take 2 tablets (2 mg total) by mouth daily. 01/23/15   Volanda Napoleon, MD  lisinopril (PRINIVIL,ZESTRIL) 10 MG tablet Take 5 mg by mouth daily.     Historical Provider, MD  rivaroxaban (XARELTO) 10 MG TABS tablet Please take one half tablet a day. Stop 2 days before any invasive procedure. 01/23/15   Volanda Napoleon, MD  sulfamethoxazole-trimethoprim (BACTRIM DS,SEPTRA DS) 800-160 MG tablet Take 1 tablet by mouth 2 (two) times daily. 09/03/15   Kandra Nicolas, MD  testosterone cypionate (DEPOTESTOSTERONE CYPIONATE) 200 MG/ML injection  01/10/15   Historical Provider, MD  VIAGRA 100 MG tablet TAKE 1 TABLET BY MOUTH 1 HOUR PRIOR TO INTERCOURSE 06/03/15   Historical Provider, MD   Meds Ordered and Administered this Visit  Medications - No data to display  BP 158/69 mmHg  Pulse 88  Temp(Src)  98.3 F (36.8 C) (Oral)  Resp 18  Wt 171 lb (77.565 kg)  SpO2 97% No data found.   Physical Exam Nursing notes and Vital Signs reviewed. Appearance:  Patient appears stated age, and in no acute distress Eyes:  Pupils are equal, round, and reactive to light and accomodation.  Extraocular movement is intact.  Conjunctivae are not inflamed  Ears:  Canals normal.  Tympanic membranes normal.  Nose:  Mildly congested turbinates.  No sinus tenderness.   Pharynx:  Normal Neck:  Supple.  No adenopathy Lungs:  Clear to auscultation.  Breath sounds are equal.  Moving air well.  No chest wall tenderness to palpation  Heart:  Regular rate and  rhythm without murmurs, rubs, or gallops.  Abdomen:  Nontender without masses or hepatosplenomegaly.  Bowel sounds are present.  No CVA or flank tenderness.  Extremities:  No edema.  Skin:  No rash present.   ED Course  Procedures none     Labs Reviewed  POCT URINALYSIS DIP (MANUAL ENTRY) - Abnormal; Notable for the following:    Leukocytes, UA Trace (*)    All other components within normal limits  URINE CULTURE  POCT CBC W AUTO DIFF ([facility]):  WBC 6.2; LY 31.5; MO 4.8; GR 63.7; Hgb 15.2; Platelets 290     Imaging Review Dg Chest 2 View  09/03/2015  CLINICAL DATA:  Choking last night.  Chest pain today. EXAM: CHEST  2 VIEW COMPARISON:  None. FINDINGS: Upper normal heart size. Lungs hyper aerated and clear. No pneumothorax. IMPRESSION: No active cardiopulmonary disease. Electronically Signed   By: Marybelle Killings M.D.   On: 09/03/2015 14:33      MDM   1. Urinary frequency; suspect early prostatitis   2. Other chest pain, non-cardiac    Urine culture pending.  Begin Bactrim DS, one BID for one week. Increase fluid intake. If symptoms become significantly worse during the night or over the weekend, proceed to the local emergency room.  Followup with PCP in one week    Kandra Nicolas, MD 09/03/15 1504

## 2015-09-03 NOTE — ED Notes (Signed)
Pt c/o LT ear pain, nonproductive cough, and pain under his LT breast x 1 wk. Denies fever. He took Keflex 500mg  bid x 3 days. He reports having intermittent CP and dizziness x 1 wk. He saw his cardiologist yesterday. They did an EKG, echo and labs.

## 2015-09-04 ENCOUNTER — Telehealth: Payer: Self-pay | Admitting: *Deleted

## 2015-09-04 NOTE — Telephone Encounter (Signed)
Patient called stating he's had about 2 weeks of GI upset: bloating, belching, some pain. He's been assessed by his cardiologist and and urgent care MD. They have found no causes and now the patient thinks it may be caused from the [place]. He would like to stop and restart his aspirin.   Spoke to Dr Marin Olp who states that this is fine. Patient can stop Xarelto and restart aspirin, one tablet daily. Patient is going to stop the Xarelto and restart aspirin.

## 2015-09-05 ENCOUNTER — Telehealth: Payer: Self-pay | Admitting: *Deleted

## 2015-09-05 LAB — URINE CULTURE
Colony Count: NO GROWTH
Organism ID, Bacteria: NO GROWTH

## 2016-11-24 ENCOUNTER — Emergency Department
Admission: EM | Admit: 2016-11-24 | Discharge: 2016-11-24 | Disposition: A | Discharge status: Home / Self Care | Payer: BLUE CROSS/BLUE SHIELD | Source: Home / Self Care | Attending: Family Medicine | Admitting: Family Medicine

## 2016-11-24 ENCOUNTER — Emergency Department (INDEPENDENT_AMBULATORY_CARE_PROVIDER_SITE_OTHER): Payer: BLUE CROSS/BLUE SHIELD

## 2016-11-24 ENCOUNTER — Encounter: Payer: Self-pay | Admitting: Emergency Medicine

## 2016-11-24 DIAGNOSIS — R1032 Left lower quadrant pain: Secondary | ICD-10-CM

## 2016-11-24 DIAGNOSIS — K5732 Diverticulitis of large intestine without perforation or abscess without bleeding: Secondary | ICD-10-CM | POA: Diagnosis not present

## 2016-11-24 DIAGNOSIS — I700 Atherosclerosis of aorta: Secondary | ICD-10-CM

## 2016-11-24 DIAGNOSIS — R911 Solitary pulmonary nodule: Secondary | ICD-10-CM | POA: Diagnosis not present

## 2016-11-24 LAB — POCT URINALYSIS DIP (MANUAL ENTRY)
BILIRUBIN UA: NEGATIVE
Glucose, UA: NEGATIVE mg/dL
Ketones, POC UA: NEGATIVE mg/dL
Leukocytes, UA: NEGATIVE
NITRITE UA: NEGATIVE
Protein Ur, POC: NEGATIVE mg/dL
RBC UA: NEGATIVE
Spec Grav, UA: 1.01 (ref 1.010–1.025)
Urobilinogen, UA: NEGATIVE E.U./dL — AB
pH, UA: 6 (ref 5.0–8.0)

## 2016-11-24 MED ORDER — METRONIDAZOLE 500 MG PO TABS: 500.0000 mg | ORAL_TABLET | Freq: Three times a day (TID) | ORAL | 0 refills | Status: AC

## 2016-11-24 MED ORDER — CIPROFLOXACIN HCL 500 MG PO TABS: 500.0000 mg | ORAL_TABLET | Freq: Two times a day (BID) | ORAL | 0 refills | Status: AC

## 2016-11-24 NOTE — Discharge Instructions (Signed)
Begin clear liquids for about 24 hours, then may begin a [employer] (Bananas, Rice, Applesauce, Toast) when improved.  Then gradually advance to a regular diet as tolerated.   May take Tylenol as needed for pain. If symptoms become significantly worse during the night or over the weekend, proceed to the local emergency room.   Avoid taking Viagra while taking Cipro and Flagyl.

## 2016-11-24 NOTE — ED Triage Notes (Signed)
Pt c/o lower abdominal pain following a workout about 2 days ago. States getting worse and last night he developed low grade fever and nausea.

## 2016-11-24 NOTE — ED Provider Notes (Signed)
[facility]    CSN: [id number] Arrival date & time: 11/24/16  1133     History   Chief Complaint Chief Complaint  Patient presents with  . Abdominal Pain    HPI Carlos Little is a 66 y.o. male.   Patient awoke yesterday and noted mild urinary frequency without dysuria.  He then developed lower abdominal bloating and left lower quadrant discomfort with mild low back ache.  He has felt fatigued and had sweats today.  He has had mild nausea without vomiting and decreased appetite.  He had a normal bowel movement today, although smaller than usual.  He denies urinary symptoms. No history of abdominal surgery.   The history is provided by the patient and the spouse.  Abdominal Pain  Pain location:  LLQ Pain quality: aching and bloating   Pain radiation: lower back. Pain severity:  Mild Onset quality:  Sudden Duration:  5 hours Timing:  Constant Progression:  Worsening Chronicity:  New Context: not awakening from sleep, not diet changes, not eating, not laxative use, not medication withdrawal, not previous surgeries, not recent illness, not recent travel, not sick contacts, not suspicious food intake and not trauma   Relieved by:  Nothing Worsened by:  Movement Ineffective treatments: Bactrim DS  Associated symptoms: anorexia, chills, fatigue, nausea and vomiting   Associated symptoms: no chest pain, no constipation, no cough, no diarrhea, no dysuria, no fever, no flatus, no hematemesis, no hematochezia, no hematuria, no melena, no shortness of breath and no sore throat     Past Medical History:  Diagnosis Date  . Hypertension     There are no active problems to display for this patient.   History reviewed. No pertinent surgical history.     Home Medications    Prior to Admission medications   Medication Sig Start Date End Date Taking? Authorizing Provider  ARMOUR THYROID 90 MG tablet Take 90 mg by mouth daily. 06/08/15   Historical Provider, MD    ciprofloxacin (CIPRO) 500 MG tablet Take 1 tablet (500 mg total) by mouth 2 (two) times daily. 11/24/16   Kandra Nicolas, MD  lisinopril (PRINIVIL,ZESTRIL) 10 MG tablet Take 5 mg by mouth daily.     Historical Provider, MD  metroNIDAZOLE (FLAGYL) 500 MG tablet Take 1 tablet (500 mg total) by mouth 3 (three) times daily. (every 8 hours) 11/24/16   Kandra Nicolas, MD  testosterone cypionate (DEPOTESTOSTERONE CYPIONATE) 200 MG/ML injection  01/10/15   Historical Provider, MD  VIAGRA 100 MG tablet TAKE 1 TABLET BY MOUTH 1 HOUR PRIOR TO INTERCOURSE 06/03/15   Historical Provider, MD    Family History Family History  Problem Relation Age of Onset  . Hypertension Father     Social History Social History  Substance Use Topics  . Smoking status: Never Smoker  . Smokeless tobacco: Never Used     Comment: NEVER USED TOBACCO  . Alcohol use No     Allergies   Patient has no known allergies.   Review of Systems Review of Systems  Constitutional: Positive for chills and fatigue. Negative for fever.  HENT: Negative for sore throat.   Respiratory: Negative for cough and shortness of breath.   Cardiovascular: Negative for chest pain.  Gastrointestinal: Positive for abdominal pain, anorexia, nausea and vomiting. Negative for constipation, diarrhea, flatus, hematemesis, hematochezia and melena.  Genitourinary: Negative for dysuria and hematuria.  All other systems reviewed and are negative.    Physical Exam Triage Vital Signs ED Triage Vitals [  11/24/16 1200]  Enc Vitals Group     BP (!) 144/89     Pulse Rate (!) 106     Resp      Temp 98.2 F (36.8 C)     Temp Source Oral     SpO2 97 %     Weight 174 lb (78.9 kg)     Height      Head Circumference      Peak Flow      Pain Score 6     Pain Loc      Pain Edu?      Excl. in [place]?    No data found.   Updated Vital Signs BP (!) 144/89 (BP Location: Right Arm)   Pulse (!) 106   Temp 98.2 F (36.8 C) (Oral)   Wt 174 lb (78.9 kg)    SpO2 97%   BMI 28.08 kg/m   Visual Acuity Right Eye Distance:   Left Eye Distance:   Bilateral Distance:    Right Eye Near:   Left Eye Near:    Bilateral Near:     Physical Exam  Constitutional: He appears well-developed and well-nourished. No distress.  HENT:  Head: Normocephalic.  Right Ear: External ear normal.  Left Ear: External ear normal.  Nose: Nose normal.  Mouth/Throat: Oropharynx is clear and moist.  Eyes: Conjunctivae are normal. Pupils are equal, round, and reactive to light.  Neck: Neck supple.  Cardiovascular: Normal heart sounds.   Rate 100  Pulmonary/Chest: Breath sounds normal.  Abdominal: Soft. Bowel sounds are normal. He exhibits no distension and no mass. There is no hepatosplenomegaly. There is tenderness in the left lower quadrant. There is no guarding, no CVA tenderness, no tenderness at McBurney's point and negative Murphy's sign. Hernia confirmed negative in the right inguinal area and confirmed negative in the left inguinal area.    Genitourinary: Testes normal and penis normal. No discharge found.     Musculoskeletal: He exhibits no edema.  Lymphadenopathy:    He has no cervical adenopathy. No inguinal adenopathy noted on the right or left side.  Neurological: He is alert.  Skin: Skin is warm and dry.  Nursing note and vitals reviewed.    UC Treatments / Results  Labs (all labs ordered are listed, but only abnormal results are displayed) Labs Reviewed  POCT URINALYSIS DIP (MANUAL ENTRY) - Abnormal; Notable for the following:       Result Value   Urobilinogen, UA negative (*)    All other components within normal limits  POCT CBC W AUTO DIFF ([facility]):  WBC 16.1; LY 16.0; MO 5.5; GR 78.5; Hgb 16.3; Platelets 313     EKG  EKG Interpretation None       Radiology Ct Abdomen Pelvis Wo Contrast  Result Date: 11/24/2016 CLINICAL DATA:  Left lower quadrant abdominal pain for 1 day. Nausea this morning. Elevated white  blood cell count. EXAM: CT ABDOMEN AND PELVIS WITHOUT CONTRAST TECHNIQUE: Multidetector CT imaging of the abdomen and pelvis was performed following the standard protocol without IV contrast. COMPARISON:  None. FINDINGS: Lower chest: 2 mm subpleural nodule in the basilar left lower lobe (series 4, image 20). No lung consolidation or pleural effusion. Hepatobiliary: No focal liver abnormality is seen. No gallstones, gallbladder wall thickening, or biliary dilatation. Pancreas: Unremarkable. Spleen: Unremarkable. Adrenals/Urinary Tract: Unremarkable adrenal glands. No evidence of renal mass, calculi, or hydronephrosis. Unremarkable bladder. Stomach/Bowel: The stomach is within normal limits. There is no evidence bowel obstruction.  There is descending and sigmoid colon diverticulosis with moderate circumferential wall thickening involving the proximal sigmoid colon. There is surrounding pericolonic inflammatory stranding without fluid collection or extraluminal gas identified. Vascular/Lymphatic: Abdominal aortic atherosclerosis without aneurysm. No enlarged lymph nodes. Reproductive: Moderate prostatic enlargement. Other: No intraperitoneal free fluid. No abdominal wall mass or hernia. Musculoskeletal: Mild thoracolumbar spondylosis. Moderate L4-5 facet arthrosis. IMPRESSION: 1. Acute uncomplicated sigmoid colon diverticulitis. 2. 2 mm left lower lobe lung nodule. No follow-up needed if patient is low-risk. Non-contrast chest CT can be considered in 12 months if patient is high-risk. This recommendation follows the consensus statement: Guidelines for Management of Incidental Pulmonary Nodules Detected on CT Images: From the Fleischner Society 2017; Radiology 2017; 284:228-243. 3.  Aortic Atherosclerosis (ICD10-I70.0). Electronically Signed   By: Logan Bores M.D.   On: 11/24/2016 15:33    Procedures Procedures (including critical care time)  Medications Ordered in UC Medications - No data to  display   Initial Impression / Assessment and Plan / UC Course  I have reviewed the triage vital signs and the nursing notes.  Pertinent labs & imaging results that were available during my care of the patient were reviewed by me and considered in my medical decision making (see chart for details).    Begin Cipro and Flagyl for one week. Begin clear liquids for about 24 hours, then may begin a [employer] (Bananas, Rice, Applesauce, Toast) when improved.  Then gradually advance to a regular diet as tolerated.   May take Tylenol as needed for pain. If symptoms become significantly worse during the night or over the weekend, proceed to the local emergency room.  Followup with Family Doctor in two days. Recommend colonoscopy at about 6 weeks.  Avoid taking Viagra while taking Cipro and Flagyl.    Final Clinical Impressions(s) / UC Diagnoses   Final diagnoses:  Diverticulitis of sigmoid colon    New Prescriptions New Prescriptions   CIPROFLOXACIN (CIPRO) 500 MG TABLET    Take 1 tablet (500 mg total) by mouth 2 (two) times daily.   METRONIDAZOLE (FLAGYL) 500 MG TABLET    Take 1 tablet (500 mg total) by mouth 3 (three) times daily. (every 8 hours)     Kandra Nicolas, MD 11/24/16 [phone]

## 2018-08-01 IMAGING — CT CT ABD-PELV W/O CM
2 of 4 series · 16 of 46 positions shown, 18 images · non-contrast
Comparison: None.

CLINICAL DATA: Left lower quadrant abdominal pain for 1 day. Nausea
this morning. Elevated white blood cell count.

EXAM:
CT ABDOMEN AND PELVIS WITHOUT CONTRAST
TECHNIQUE: Multidetector CT imaging of the abdomen and pelvis was performed
following the standard protocol without IV contrast.

[Series 2: axial st · axial · 0.77mm/px · z∈[-503,-78]mm · 13 of 93 slices shown, 15 images]
[im 4/93  soft-tissue]
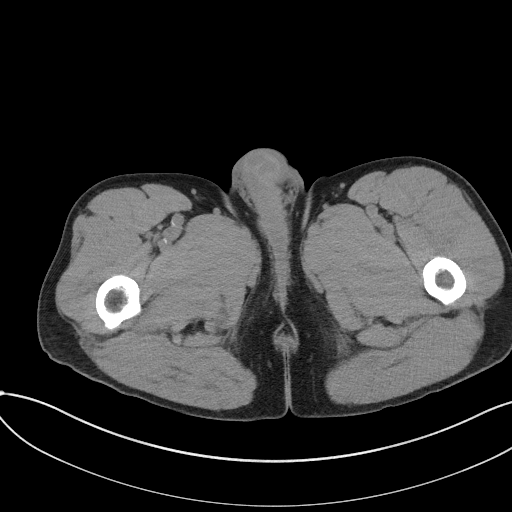
[im 4/93  bone]
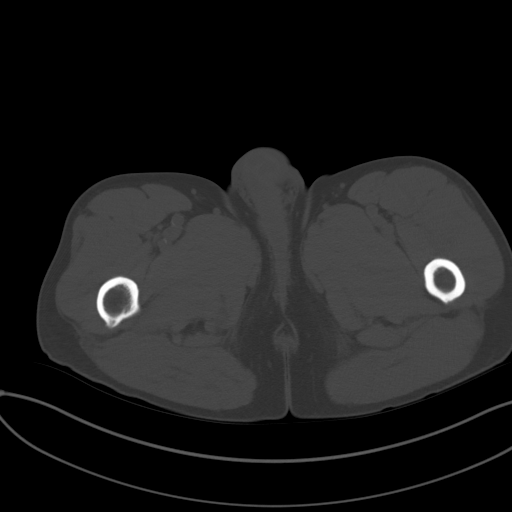
[im 12/93  soft-tissue]
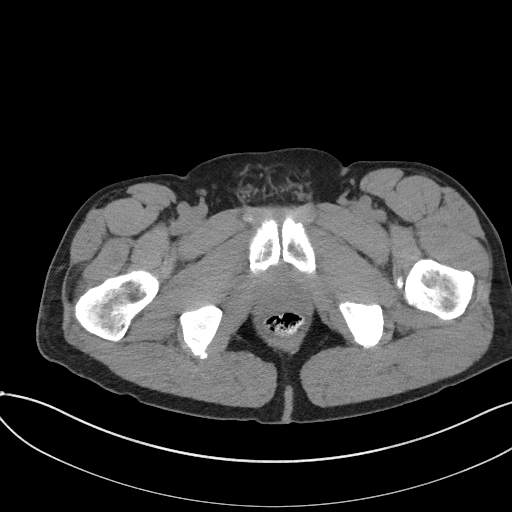
[im 20/93  soft-tissue]
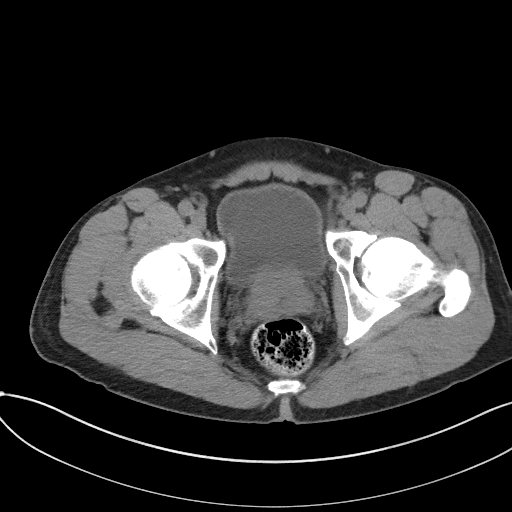
[im 27/93  soft-tissue]
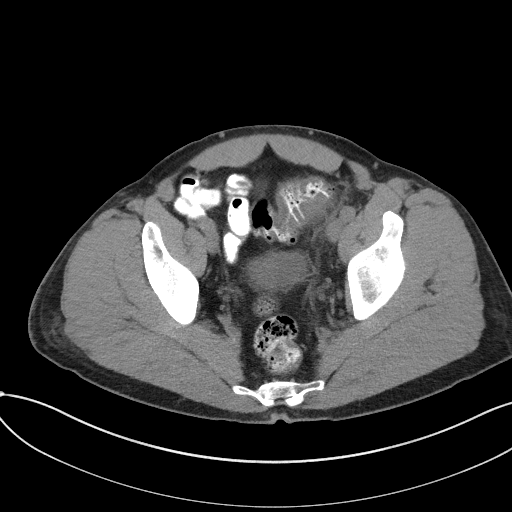
[im 31/93  soft-tissue]
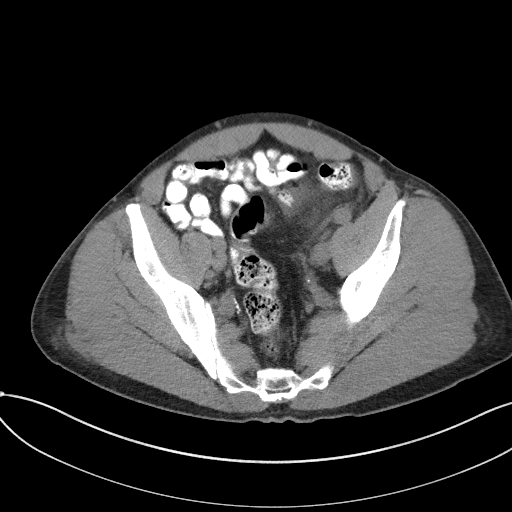
[im 39/93  soft-tissue]
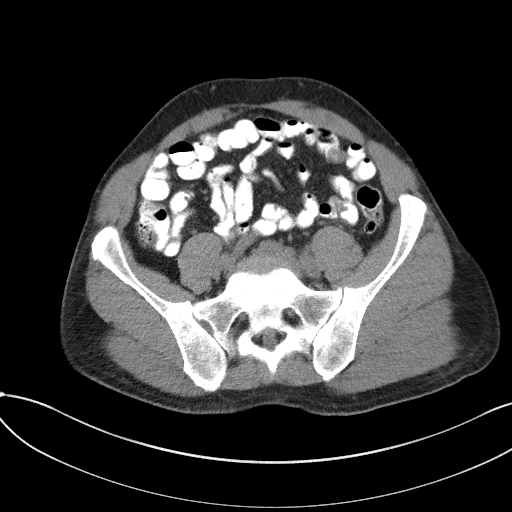
[im 47/93  soft-tissue]
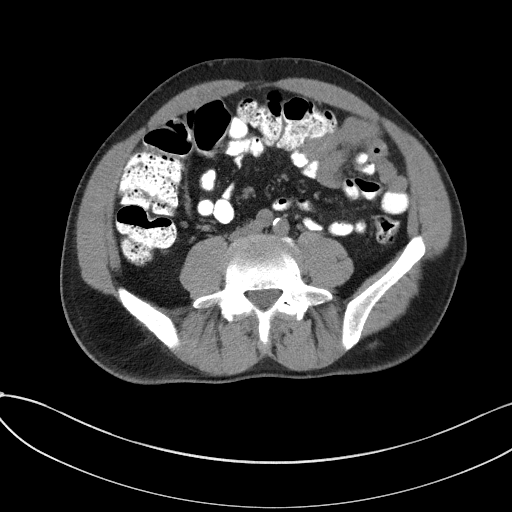
[im 54/93  soft-tissue]
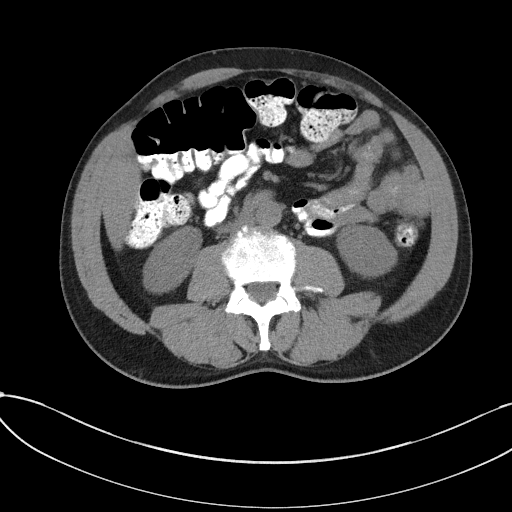
[im 62/93  soft-tissue]
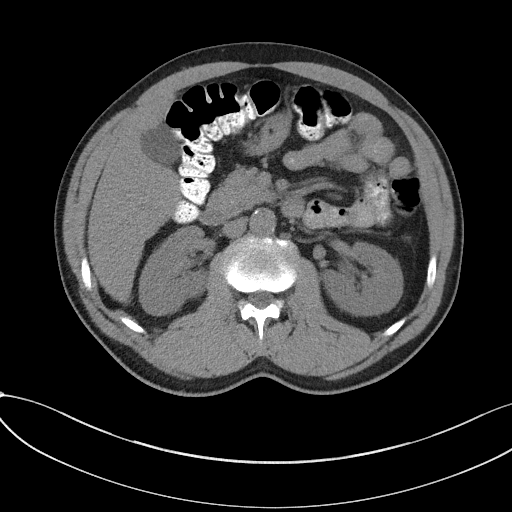
[im 62/93  bone]
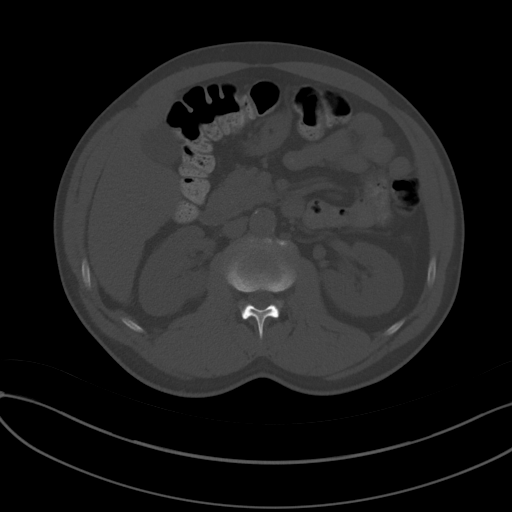
[im 66/93  soft-tissue]
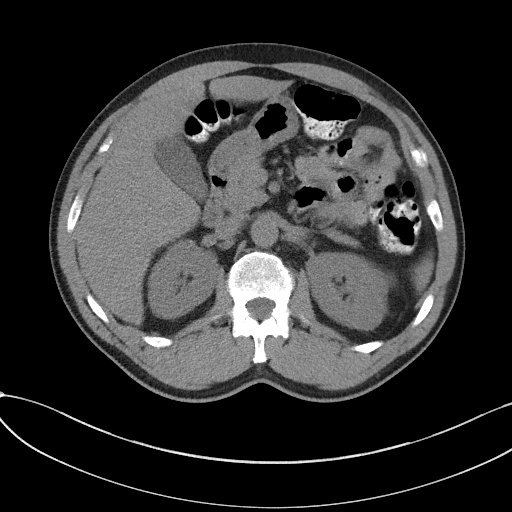
[im 73/93  soft-tissue]
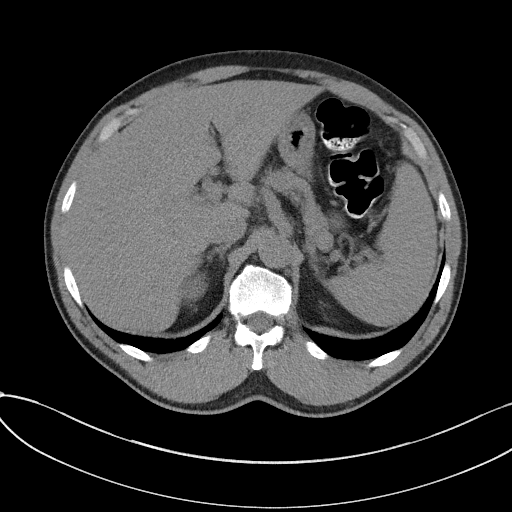
[im 81/93  soft-tissue]
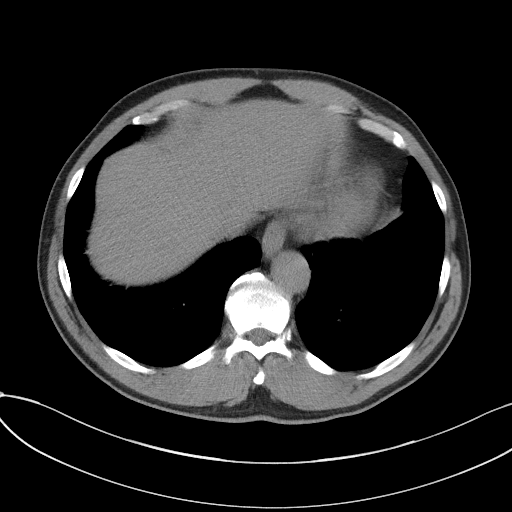
[im 89/93  soft-tissue]
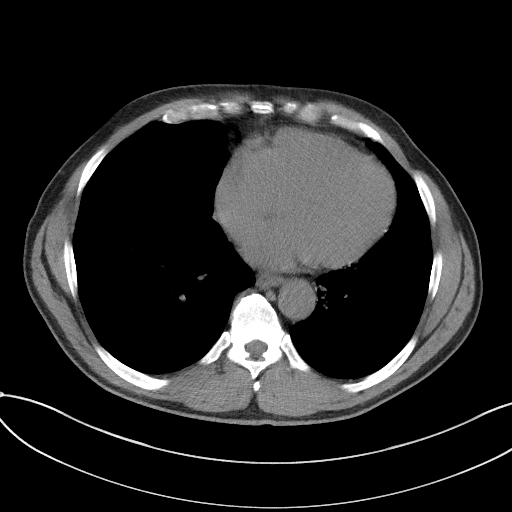

[Series 5: coronal st · coronal · 0.87mm/px · 3 of 98 slices shown]
[im 33/98  soft-tissue]
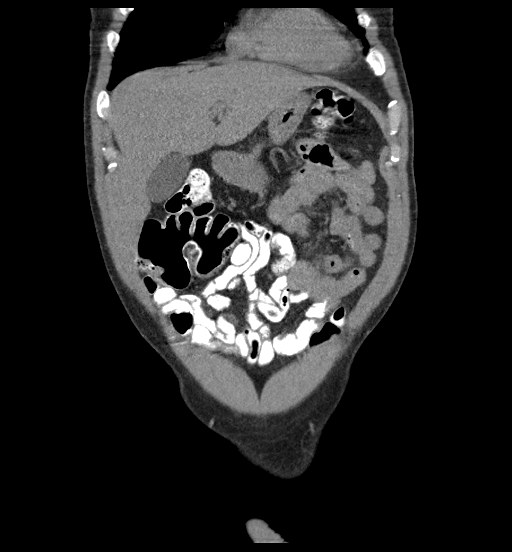
[im 44/98  soft-tissue]
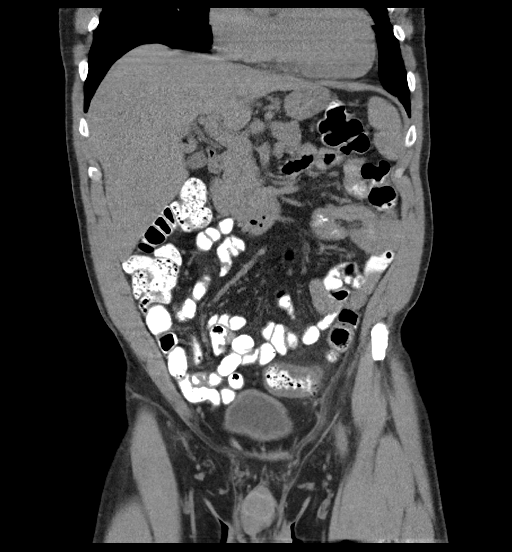
[im 54/98  soft-tissue]
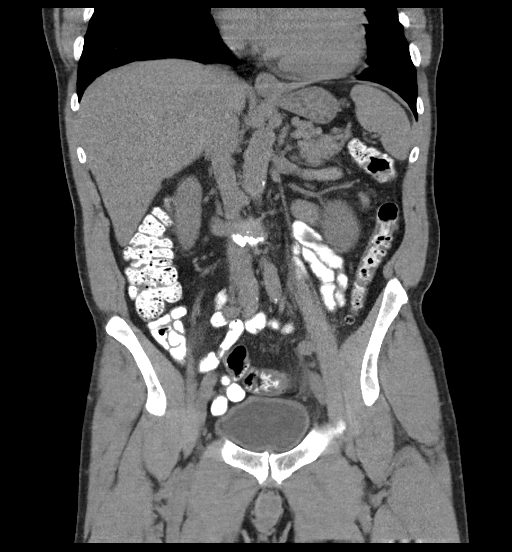

[16 of 46 positions shown; findings below may reference images not displayed]

FINDINGS: Lower chest: 2 mm subpleural nodule in the basilar left lower lobe
(series 4, image 20). No lung consolidation or pleural effusion.

Hepatobiliary: No focal liver abnormality is seen. No gallstones,
gallbladder wall thickening, or biliary dilatation.

Pancreas: Unremarkable.

Spleen: Unremarkable.

Adrenals/Urinary Tract: Unremarkable adrenal glands. No evidence of
renal mass, calculi, or hydronephrosis. Unremarkable bladder.

Stomach/Bowel: The stomach is within normal limits. There is no
evidence bowel obstruction. There is descending and sigmoid colon
diverticulosis with moderate circumferential wall thickening
involving the proximal sigmoid colon. There is surrounding
pericolonic inflammatory stranding without fluid collection or
extraluminal gas identified.

Vascular/Lymphatic: Abdominal aortic atherosclerosis without
aneurysm. No enlarged lymph nodes.

Reproductive: Moderate prostatic enlargement.

Other: No intraperitoneal free fluid. No abdominal wall mass or
hernia.

Musculoskeletal: Mild thoracolumbar spondylosis. Moderate L4-5 facet
arthrosis.
IMPRESSION: 1. Acute uncomplicated sigmoid colon diverticulitis.
2. 2 mm left lower lobe lung nodule. No follow-up needed if patient
is low-risk. Non-contrast chest CT can be considered in 12 months if
patient is high-risk. This recommendation follows the consensus
statement: Guidelines for Management of Incidental Pulmonary Nodules
Detected on CT Images: From the [HOSPITAL] 0269; Radiology
3.  Aortic Atherosclerosis (CMAZK-RBV.V).

## 2021-06-22 ENCOUNTER — Other Ambulatory Visit: Payer: Self-pay

## 2021-06-22 ENCOUNTER — Emergency Department (INDEPENDENT_AMBULATORY_CARE_PROVIDER_SITE_OTHER)
Admission: EM | Admit: 2021-06-22 | Discharge: 2021-06-22 | Disposition: A | Discharge status: Home / Self Care | Payer: Medicare Other | Source: Home / Self Care

## 2021-06-22 DIAGNOSIS — R059 Cough, unspecified: Secondary | ICD-10-CM

## 2021-06-22 DIAGNOSIS — R509 Fever, unspecified: Secondary | ICD-10-CM

## 2021-06-22 MED ORDER — CEFDINIR 300 MG PO CAPS: 300.0000 mg | ORAL_CAPSULE | Freq: Two times a day (BID) | ORAL | 0 refills | Status: AC

## 2021-06-22 MED ORDER — PREDNISONE 20 MG PO TABS: ORAL_TABLET | ORAL | 0 refills | Status: AC

## 2021-06-22 NOTE — Discharge Instructions (Addendum)
Advised patient to take medications as directed with food to completion.  Patient increase daily water intake while taking these medications.  Advised patient we will follow-up with COVID-19, flu A/B results once received.

## 2021-06-22 NOTE — ED Triage Notes (Signed)
Pt c/o cough, congestion, fever and bodyaches. Says cough and congestion started 2 weeks ago. Fever developed yesterday. Tmax 100.8 this am. 2 Advil an hour and half ago. COVID test neg this am.

## 2021-06-22 NOTE — ED Provider Notes (Signed)
[facility]    CSN: [id number] Arrival date & time: 06/22/21  1013      History   Chief Complaint Chief Complaint  Patient presents with   Cough   Fever   Generalized Body Aches   Nasal Congestion    HPI Carlos Little is a 70 y.o. male.   HPI 70 year old male presents with cough, congestion, fever and body aches for 2 weeks.  Patient reports temperature max of 100.8 this morning.  Reports taking 400 mg of Advil 1.5 hours ago.  COVID test negative this morning.  Past Medical History:  Diagnosis Date   Hypertension     There are no problems to display for this patient.   History reviewed. No pertinent surgical history.     Home Medications    Prior to Admission medications   Medication Sig Start Date End Date Taking? Authorizing Provider  cefdinir (OMNICEF) 300 MG capsule Take 1 capsule (300 mg total) by mouth 2 (two) times daily for 7 days. 06/22/21 06/29/21 Yes Eliezer Lofts, FNP  predniSONE (DELTASONE) 20 MG tablet Take 3 tabs PO daily x 5 days. 06/22/21  Yes Eliezer Lofts, FNP  ARMOUR THYROID 90 MG tablet Take 90 mg by mouth daily. 06/08/15   [provider]  ciprofloxacin (CIPRO) 500 MG tablet Take 1 tablet (500 mg total) by mouth 2 (two) times daily. 11/24/16   Kandra Nicolas, MD  lisinopril (PRINIVIL,ZESTRIL) 10 MG tablet Take 5 mg by mouth daily.     [provider]  metroNIDAZOLE (FLAGYL) 500 MG tablet Take 1 tablet (500 mg total) by mouth 3 (three) times daily. (every 8 hours) 11/24/16   Kandra Nicolas, MD  testosterone cypionate (DEPOTESTOSTERONE CYPIONATE) 200 MG/ML injection  01/10/15   [provider]  VIAGRA 100 MG tablet TAKE 1 TABLET BY MOUTH 1 HOUR PRIOR TO INTERCOURSE 06/03/15   [provider]    Family History Family History  Problem Relation Age of Onset   Hypertension Father     Social History Social History   Tobacco Use   Smoking status: Never   Smokeless tobacco: Never   Tobacco  comments:    NEVER USED TOBACCO  Substance Use Topics   Alcohol use: No    Alcohol/week: 0.0 standard drinks   Drug use: No     Allergies   Patient has no known allergies.   Review of Systems Review of Systems  Constitutional:  Positive for fever.  HENT:  Positive for congestion.   Respiratory:  Positive for cough.   Musculoskeletal:  Positive for myalgias.  All other systems reviewed and are negative.   Physical Exam Triage Vital Signs ED Triage Vitals  Enc Vitals Group     BP 06/22/21 1022 (!) 159/88     Pulse Rate 06/22/21 1022 (!) 117     Resp 06/22/21 1022 18     Temp 06/22/21 1022 98.4 F (36.9 C)     Temp Source 06/22/21 1022 Oral     SpO2 06/22/21 1022 97 %     Weight --      Height --      Head Circumference --      Peak Flow --      Pain Score 06/22/21 1024 0     Pain Loc --      Pain Edu? --      Excl. in [place]? --    No data found.  Updated Vital Signs BP (!) 159/88 (BP Location: Right  Arm)   Pulse (!) 117   Temp 98.4 F (36.9 C) (Oral)   Resp 18   SpO2 97%     Physical Exam Vitals and nursing note reviewed.  Constitutional:      Appearance: Normal appearance. He is normal weight.  HENT:     Head: Normocephalic.     Right Ear: Tympanic membrane, ear canal and external ear normal.     Left Ear: Tympanic membrane, ear canal and external ear normal.     Mouth/Throat:     Mouth: Mucous membranes are moist.     Pharynx: Oropharynx is clear.  Eyes:     Extraocular Movements: Extraocular movements intact.     Conjunctiva/sclera: Conjunctivae normal.     Pupils: Pupils are equal, round, and reactive to light.  Cardiovascular:     Rate and Rhythm: Normal rate and regular rhythm.     Pulses: Normal pulses.     Heart sounds: Normal heart sounds.  Pulmonary:     Effort: Pulmonary effort is normal.     Breath sounds: Normal breath sounds.  Musculoskeletal:        General: Normal range of motion.     Cervical back: Normal range of motion and  neck supple.  Skin:    General: Skin is warm and dry.  Neurological:     General: No focal deficit present.     Mental Status: He is alert and oriented to person, place, and time.     UC Treatments / Results  Labs (all labs ordered are listed, but only abnormal results are displayed) Labs Reviewed  COVID-19, FLU A+B NAA    EKG   Radiology No results found.  Procedures Procedures (including critical care time)  Medications Ordered in UC Medications - No data to display  Initial Impression / Assessment and Plan / UC Course  I have reviewed the triage vital signs and the nursing notes.  Pertinent labs & imaging results that were available during my care of the patient were reviewed by me and considered in my medical decision making (see chart for details).     MDM: 1. Cough-Rx'd cefdinir and prednisone burst; 2.  Fever-COVID-19 flu A/B ordered. Advised patient to take medications as directed with food to completion.  Patient increase daily water intake while taking these medications.  Advised patient we will follow-up with COVID-19, flu A/B results once received.  Discharged home, hemodynamically stable. Final Clinical Impressions(s) / UC Diagnoses   Final diagnoses:  Cough, unspecified type  Fever, unspecified     Discharge Instructions      Advised patient to take medications as directed with food to completion.  Patient increase daily water intake while taking these medications.  Advised patient we will follow-up with COVID-19, flu A/B results once received.     ED Prescriptions     Medication Sig Dispense Auth. Provider   cefdinir (OMNICEF) 300 MG capsule Take 1 capsule (300 mg total) by mouth 2 (two) times daily for 7 days. 14 capsule Eliezer Lofts, FNP   predniSONE (DELTASONE) 20 MG tablet Take 3 tabs PO daily x 5 days. 15 tablet Eliezer Lofts, FNP      PDMP not reviewed this encounter.   Eliezer Lofts, [place] 06/22/21 1116

## 2021-06-24 ENCOUNTER — Telehealth: Payer: Self-pay | Admitting: Emergency Medicine

## 2021-06-24 NOTE — Telephone Encounter (Signed)
Call back to Tim regarding COVID/ Flu results, still in process. Pt does not have My Chart. Pt wanted to know his results because he has done well w/ tamiflu in the past. RN explained that Tamiflu worked best in the 1st 48 hours of the onset of symptoms.

## 2021-06-25 ENCOUNTER — Telehealth: Payer: Self-pay

## 2021-06-25 LAB — COVID-19, FLU A+B NAA
Influenza A, NAA: DETECTED — AB
Influenza B, NAA: NOT DETECTED
SARS-CoV-2, NAA: NOT DETECTED

## 2021-06-25 NOTE — Telephone Encounter (Signed)
TCT to pt regarding outcome of positive test result and desire for tamiflu. S Nelson consulted and due to pt being outside of the effective window, pt is unable to receive. Pt notified and effectiveness discussed. Pt sates he presented with the desire for tamiflu based on his sx and was not offered a rapid flu test.

## 2022-08-12 ENCOUNTER — Encounter (HOSPITAL_COMMUNITY): Payer: Self-pay

## 2022-08-12 ENCOUNTER — Other Ambulatory Visit (HOSPITAL_COMMUNITY): Payer: Self-pay | Admitting: Internal Medicine

## 2022-08-12 ENCOUNTER — Telehealth (HOSPITAL_COMMUNITY): Payer: Self-pay | Admitting: Emergency Medicine

## 2022-08-12 DIAGNOSIS — R072 Precordial pain: Secondary | ICD-10-CM

## 2022-08-12 DIAGNOSIS — R079 Chest pain, unspecified: Secondary | ICD-10-CM

## 2022-08-12 DIAGNOSIS — R0602 Shortness of breath: Secondary | ICD-10-CM

## 2022-08-12 MED ORDER — METOPROLOL TARTRATE 100 MG PO TABS: 100.0000 mg | ORAL_TABLET | Freq: Once | ORAL | 0 refills | Status: DC

## 2022-08-12 NOTE — Telephone Encounter (Signed)
Reaching out to patient to offer assistance regarding upcoming cardiac imaging study; pt verbalizes understanding of appt date/time, parking situation and where to check in, pre-test NPO status and medications ordered, and verified current allergies; name and call back number provided for further questions should they arise Marchia Bond RN Navigator Cardiac Imaging [facility] [phone] office [phone] cell  Arrival 100 WC entrnace Denies iv issues Aware contrast/ nitro '100mg'$  metoprolol tartrate  Holding viagra

## 2022-08-16 ENCOUNTER — Ambulatory Visit (HOSPITAL_COMMUNITY)
Admission: RE | Admit: 2022-08-16 | Discharge: 2022-08-16 | Disposition: A | Discharge status: Home / Self Care | Payer: Medicare Other | Source: Ambulatory Visit | Attending: Internal Medicine | Admitting: Internal Medicine

## 2022-08-16 ENCOUNTER — Other Ambulatory Visit (HOSPITAL_COMMUNITY): Payer: Self-pay | Admitting: *Deleted

## 2022-08-16 ENCOUNTER — Encounter (HOSPITAL_COMMUNITY): Payer: Self-pay

## 2022-08-16 DIAGNOSIS — R079 Chest pain, unspecified: Secondary | ICD-10-CM

## 2022-08-16 DIAGNOSIS — R0602 Shortness of breath: Secondary | ICD-10-CM | POA: Diagnosis present

## 2022-08-16 DIAGNOSIS — R072 Precordial pain: Secondary | ICD-10-CM | POA: Diagnosis present

## 2022-08-16 MED ORDER — IVABRADINE HCL 7.5 MG PO TABS: ORAL_TABLET | ORAL | 0 refills | Status: AC

## 2022-08-16 MED ORDER — METOPROLOL TARTRATE 100 MG PO TABS: 100.0000 mg | ORAL_TABLET | Freq: Once | ORAL | 0 refills | Status: AC

## 2022-08-16 MED ORDER — METOPROLOL TARTRATE 5 MG/5ML IV SOLN
10.0000 mg | Freq: Once | INTRAVENOUS | Status: AC
  Administered 2022-08-16: 10 mg via INTRAVENOUS

## 2022-08-16 MED ORDER — DILTIAZEM HCL 25 MG/5ML IV SOLN
10.0000 mg | Freq: Once | INTRAVENOUS | Status: AC
  Administered 2022-08-16: 10 mg via INTRAVENOUS

## 2022-08-16 MED ORDER — DILTIAZEM HCL 25 MG/5ML IV SOLN
INTRAVENOUS | Status: AC
  Filled 2022-08-16: qty 5

## 2022-08-16 MED ORDER — NITROGLYCERIN 0.4 MG SL SUBL: 0.8000 mg | SUBLINGUAL_TABLET | Freq: Once | SUBLINGUAL | Status: DC

## 2022-08-16 MED ORDER — METOPROLOL TARTRATE 5 MG/5ML IV SOLN
INTRAVENOUS | Status: AC
  Filled 2022-08-16: qty 10

## 2022-08-16 NOTE — Progress Notes (Signed)
Patient arrived for Cardiac CT took Metoprolol '100mg'$  PO pre-medications. Reports heart rate usually 80-100. Administered Metoprolol '10mg'$  IV and Cardizem 10 mg IV heart rate maintained between 76-82. Notified Surveyor, mining and plan of care is to reschedule with pre medications Metoprolol and Ivabradine. Patient verbalized understanding of plan and care and asked to have the soonest available appointment possible.

## 2022-08-17 ENCOUNTER — Ambulatory Visit (HOSPITAL_COMMUNITY)
Admission: RE | Admit: 2022-08-17 | Discharge: 2022-08-17 | Disposition: A | Discharge status: Home / Self Care | Payer: Medicare Other | Source: Ambulatory Visit | Attending: Internal Medicine | Admitting: Internal Medicine

## 2022-08-17 ENCOUNTER — Encounter (HOSPITAL_COMMUNITY): Payer: Self-pay

## 2022-08-17 DIAGNOSIS — R0602 Shortness of breath: Secondary | ICD-10-CM | POA: Insufficient documentation

## 2022-08-17 DIAGNOSIS — R072 Precordial pain: Secondary | ICD-10-CM

## 2022-08-17 MED ORDER — IOHEXOL 350 MG/ML SOLN
95.0000 mL | Freq: Once | INTRAVENOUS | Status: AC | PRN
  Administered 2022-08-17: 95 mL via INTRAVENOUS

## 2022-08-17 MED ORDER — DILTIAZEM HCL 25 MG/5ML IV SOLN: 10.0000 mg | Freq: Once | INTRAVENOUS | Status: AC

## 2022-08-17 MED ORDER — METOPROLOL TARTRATE 5 MG/5ML IV SOLN
INTRAVENOUS | Status: AC
  Filled 2022-08-17: qty 10

## 2022-08-17 MED ORDER — NITROGLYCERIN 0.4 MG SL SUBL
0.8000 mg | SUBLINGUAL_TABLET | Freq: Once | SUBLINGUAL | Status: AC
  Administered 2022-08-17: 0.8 mg via SUBLINGUAL

## 2022-08-17 MED ORDER — DILTIAZEM LOAD VIA INFUSION: 10.0000 mg | Freq: Once | INTRAVENOUS | Status: DC

## 2022-08-17 MED ORDER — DILTIAZEM HCL 25 MG/5ML IV SOLN
INTRAVENOUS | Status: AC
  Administered 2022-08-17: 10 mg via INTRAVENOUS
  Filled 2022-08-17: qty 5

## 2022-08-17 MED ORDER — NITROGLYCERIN 0.4 MG SL SUBL
SUBLINGUAL_TABLET | SUBLINGUAL | Status: AC
  Filled 2022-08-17: qty 2

## 2022-08-17 MED ORDER — METOPROLOL TARTRATE 5 MG/5ML IV SOLN
5.0000 mg | Freq: Once | INTRAVENOUS | Status: AC
  Administered 2022-08-17: 5 mg via INTRAVENOUS

## 2024-02-22 ENCOUNTER — Other Ambulatory Visit: Payer: Self-pay | Admitting: Orthopaedic Surgery

## 2024-02-22 DIAGNOSIS — G8929 Other chronic pain: Secondary | ICD-10-CM

## 2024-02-24 ENCOUNTER — Ambulatory Visit
Admission: RE | Admit: 2024-02-24 | Discharge: 2024-02-24 | Disposition: A | Discharge status: Home / Self Care | Source: Ambulatory Visit | Attending: Orthopaedic Surgery | Admitting: Orthopaedic Surgery

## 2024-02-24 DIAGNOSIS — G8929 Other chronic pain: Secondary | ICD-10-CM
